# Patient Record
Sex: Male | Born: 1965 | Race: White | Hispanic: No | Marital: Single | Smoking: Current every day smoker
Health system: Southern US, Community
[De-identification: ages and names within clinical notes are randomized; demographics above are authoritative.]

## PROBLEM LIST (undated history)

## (undated) DIAGNOSIS — T300 Burn of unspecified body region, unspecified degree: Secondary | ICD-10-CM

## (undated) DIAGNOSIS — W3400XA Accidental discharge from unspecified firearms or gun, initial encounter: Secondary | ICD-10-CM

## (undated) DIAGNOSIS — I10 Essential (primary) hypertension: Secondary | ICD-10-CM

## (undated) DIAGNOSIS — R569 Unspecified convulsions: Secondary | ICD-10-CM

## (undated) DIAGNOSIS — F32A Depression, unspecified: Secondary | ICD-10-CM

## (undated) DIAGNOSIS — I219 Acute myocardial infarction, unspecified: Secondary | ICD-10-CM

## (undated) DIAGNOSIS — S21339A Puncture wound without foreign body of unspecified front wall of thorax with penetration into thoracic cavity, initial encounter: Secondary | ICD-10-CM

## (undated) DIAGNOSIS — I639 Cerebral infarction, unspecified: Secondary | ICD-10-CM

## (undated) DIAGNOSIS — I499 Cardiac arrhythmia, unspecified: Secondary | ICD-10-CM

## (undated) DIAGNOSIS — F1010 Alcohol abuse, uncomplicated: Secondary | ICD-10-CM

## (undated) DIAGNOSIS — F329 Major depressive disorder, single episode, unspecified: Secondary | ICD-10-CM

---

## 2006-02-05 ENCOUNTER — Emergency Department (HOSPITAL_COMMUNITY): Admission: EM | Admit: 2006-02-05 | Discharge: 2006-02-05 | Payer: Self-pay | Admitting: Emergency Medicine

## 2013-06-16 ENCOUNTER — Encounter (HOSPITAL_COMMUNITY): Payer: Self-pay | Admitting: Emergency Medicine

## 2013-06-16 ENCOUNTER — Emergency Department (HOSPITAL_COMMUNITY)
Admission: EM | Admit: 2013-06-16 | Discharge: 2013-06-17 | Disposition: A | Discharge status: Other Acute Inpatient Hospital | Payer: Self-pay | Attending: Emergency Medicine | Admitting: Emergency Medicine

## 2013-06-16 DIAGNOSIS — Z8673 Personal history of transient ischemic attack (TIA), and cerebral infarction without residual deficits: Secondary | ICD-10-CM | POA: Insufficient documentation

## 2013-06-16 DIAGNOSIS — G40909 Epilepsy, unspecified, not intractable, without status epilepticus: Secondary | ICD-10-CM | POA: Insufficient documentation

## 2013-06-16 DIAGNOSIS — Z87828 Personal history of other (healed) physical injury and trauma: Secondary | ICD-10-CM | POA: Insufficient documentation

## 2013-06-16 DIAGNOSIS — I252 Old myocardial infarction: Secondary | ICD-10-CM | POA: Insufficient documentation

## 2013-06-16 DIAGNOSIS — F1020 Alcohol dependence, uncomplicated: Secondary | ICD-10-CM | POA: Diagnosis present

## 2013-06-16 DIAGNOSIS — F17200 Nicotine dependence, unspecified, uncomplicated: Secondary | ICD-10-CM | POA: Insufficient documentation

## 2013-06-16 DIAGNOSIS — F32A Depression, unspecified: Secondary | ICD-10-CM | POA: Diagnosis present

## 2013-06-16 DIAGNOSIS — R21 Rash and other nonspecific skin eruption: Secondary | ICD-10-CM | POA: Insufficient documentation

## 2013-06-16 DIAGNOSIS — F10239 Alcohol dependence with withdrawal, unspecified: Secondary | ICD-10-CM

## 2013-06-16 DIAGNOSIS — Z9289 Personal history of other medical treatment: Secondary | ICD-10-CM

## 2013-06-16 DIAGNOSIS — F10939 Alcohol use, unspecified with withdrawal, unspecified: Secondary | ICD-10-CM

## 2013-06-16 DIAGNOSIS — F329 Major depressive disorder, single episode, unspecified: Secondary | ICD-10-CM | POA: Diagnosis present

## 2013-06-16 DIAGNOSIS — D696 Thrombocytopenia, unspecified: Secondary | ICD-10-CM | POA: Insufficient documentation

## 2013-06-16 DIAGNOSIS — Z09 Encounter for follow-up examination after completed treatment for conditions other than malignant neoplasm: Secondary | ICD-10-CM

## 2013-06-16 DIAGNOSIS — F10929 Alcohol use, unspecified with intoxication, unspecified: Secondary | ICD-10-CM

## 2013-06-16 DIAGNOSIS — I10 Essential (primary) hypertension: Secondary | ICD-10-CM | POA: Insufficient documentation

## 2013-06-16 DIAGNOSIS — R7989 Other specified abnormal findings of blood chemistry: Secondary | ICD-10-CM | POA: Insufficient documentation

## 2013-06-16 DIAGNOSIS — Z79899 Other long term (current) drug therapy: Secondary | ICD-10-CM | POA: Insufficient documentation

## 2013-06-16 DIAGNOSIS — F10229 Alcohol dependence with intoxication, unspecified: Secondary | ICD-10-CM | POA: Insufficient documentation

## 2013-06-16 DIAGNOSIS — F1010 Alcohol abuse, uncomplicated: Secondary | ICD-10-CM

## 2013-06-16 HISTORY — DX: Burn of unspecified body region, unspecified degree: T30.0

## 2013-06-16 HISTORY — DX: Major depressive disorder, single episode, unspecified: F32.9

## 2013-06-16 HISTORY — DX: Accidental discharge from unspecified firearms or gun, initial encounter: W34.00XA

## 2013-06-16 HISTORY — DX: Alcohol abuse, uncomplicated: F10.10

## 2013-06-16 HISTORY — DX: Essential (primary) hypertension: I10

## 2013-06-16 HISTORY — DX: Cerebral infarction, unspecified: I63.9

## 2013-06-16 HISTORY — DX: Puncture wound without foreign body of unspecified front wall of thorax with penetration into thoracic cavity, initial encounter: S21.339A

## 2013-06-16 HISTORY — DX: Cardiac arrhythmia, unspecified: I49.9

## 2013-06-16 HISTORY — DX: Acute myocardial infarction, unspecified: I21.9

## 2013-06-16 HISTORY — DX: Depression, unspecified: F32.A

## 2013-06-16 HISTORY — DX: Unspecified convulsions: R56.9

## 2013-06-16 LAB — URINALYSIS, ROUTINE W REFLEX MICROSCOPIC
Bilirubin Urine: NEGATIVE
Glucose, UA: NEGATIVE mg/dL
Hgb urine dipstick: NEGATIVE
Ketones, ur: NEGATIVE mg/dL
Leukocytes, UA: NEGATIVE
Nitrite: NEGATIVE
Protein, ur: NEGATIVE mg/dL
Specific Gravity, Urine: 1.004 — ABNORMAL LOW (ref 1.005–1.030)
Urobilinogen, UA: 1 mg/dL (ref 0.0–1.0)
pH: 6 (ref 5.0–8.0)

## 2013-06-16 LAB — CBC WITH DIFFERENTIAL/PLATELET
Basophils Absolute: 0.1 10*3/uL (ref 0.0–0.1)
Basophils Relative: 3 % — ABNORMAL HIGH (ref 0–1)
Eosinophils Absolute: 0.1 10*3/uL (ref 0.0–0.7)
Eosinophils Relative: 3 % (ref 0–5)
HCT: 41.2 % (ref 39.0–52.0)
Hemoglobin: 14.2 g/dL (ref 13.0–17.0)
Lymphocytes Relative: 38 % (ref 12–46)
Lymphs Abs: 1.2 10*3/uL (ref 0.7–4.0)
MCH: 35.1 pg — ABNORMAL HIGH (ref 26.0–34.0)
MCHC: 34.5 g/dL (ref 30.0–36.0)
MCV: 101.7 fL — ABNORMAL HIGH (ref 78.0–100.0)
Monocytes Absolute: 0.4 10*3/uL (ref 0.1–1.0)
Monocytes Relative: 13 % — ABNORMAL HIGH (ref 3–12)
Neutro Abs: 1.3 10*3/uL — ABNORMAL LOW (ref 1.7–7.7)
Neutrophils Relative %: 43 % (ref 43–77)
Platelets: 47 10*3/uL — ABNORMAL LOW (ref 150–400)
RBC: 4.05 MIL/uL — ABNORMAL LOW (ref 4.22–5.81)
RDW: 15.1 % (ref 11.5–15.5)
WBC: 3.1 10*3/uL — ABNORMAL LOW (ref 4.0–10.5)

## 2013-06-16 LAB — COMPREHENSIVE METABOLIC PANEL
ALT: 115 U/L — ABNORMAL HIGH (ref 0–53)
AST: 244 U/L — ABNORMAL HIGH (ref 0–37)
Albumin: 3.8 g/dL (ref 3.5–5.2)
Alkaline Phosphatase: 104 U/L (ref 39–117)
BUN: 7 mg/dL (ref 6–23)
CO2: 22 mEq/L (ref 19–32)
Calcium: 9 mg/dL (ref 8.4–10.5)
Chloride: 104 mEq/L (ref 96–112)
Creatinine, Ser: 0.47 mg/dL — ABNORMAL LOW (ref 0.50–1.35)
GFR calc Af Amer: >90 mL/min (ref >90)
GFR calc non Af Amer: >90 mL/min (ref >90)
Glucose, Bld: 82 mg/dL (ref 70–99)
Potassium: 4.5 mEq/L (ref 3.7–5.3)
Sodium: 141 mEq/L (ref 137–147)
Total Bilirubin: 2 mg/dL — ABNORMAL HIGH (ref 0.3–1.2)
Total Protein: 9 g/dL — ABNORMAL HIGH (ref 6.0–8.3)

## 2013-06-16 LAB — ETHANOL: Alcohol, Ethyl (B): 325 mg/dL — ABNORMAL HIGH (ref 0–11)

## 2013-06-16 LAB — RAPID URINE DRUG SCREEN, HOSP PERFORMED
Amphetamines: NOT DETECTED
Barbiturates: NOT DETECTED
Benzodiazepines: NOT DETECTED
Cocaine: NOT DETECTED
Opiates: NOT DETECTED
Tetrahydrocannabinol: NOT DETECTED

## 2013-06-16 MED ORDER — TERBINAFINE HCL 1 % EX CREA
TOPICAL_CREAM | Freq: Two times a day (BID) | CUTANEOUS | Status: DC
  Administered 2013-06-16 – 2013-06-17 (×2): via TOPICAL
  Filled 2013-06-16: qty 12

## 2013-06-16 MED ORDER — SERTRALINE HCL 50 MG PO TABS
50.0000 mg | ORAL_TABLET | Freq: Every day | ORAL | Status: DC
  Administered 2013-06-17: 50 mg via ORAL
  Filled 2013-06-16: qty 1

## 2013-06-16 MED ORDER — LORAZEPAM 1 MG PO TABS
0.0000 mg | ORAL_TABLET | Freq: Four times a day (QID) | ORAL | Status: DC
  Administered 2013-06-16: 2 mg via ORAL
  Administered 2013-06-17: 1 mg via ORAL
  Filled 2013-06-16: qty 1
  Filled 2013-06-16: qty 2

## 2013-06-16 MED ORDER — GABAPENTIN 300 MG PO CAPS
600.0000 mg | ORAL_CAPSULE | Freq: Every day | ORAL | Status: DC
  Administered 2013-06-16: 600 mg via ORAL
  Filled 2013-06-16 (×2): qty 2

## 2013-06-16 MED ORDER — VITAMIN B-1 100 MG PO TABS
100.0000 mg | ORAL_TABLET | Freq: Every day | ORAL | Status: DC
  Administered 2013-06-16 – 2013-06-17 (×2): 100 mg via ORAL
  Filled 2013-06-16 (×3): qty 1

## 2013-06-16 MED ORDER — LORAZEPAM 1 MG PO TABS: 0.0000 mg | ORAL_TABLET | Freq: Two times a day (BID) | ORAL | Status: DC

## 2013-06-16 MED ORDER — GABAPENTIN 300 MG PO CAPS
300.0000 mg | ORAL_CAPSULE | Freq: Two times a day (BID) | ORAL | Status: DC
  Administered 2013-06-17: 300 mg via ORAL
  Filled 2013-06-16 (×2): qty 1

## 2013-06-16 MED ORDER — GABAPENTIN 300 MG PO CAPS: 300.0000 mg | ORAL_CAPSULE | Freq: Three times a day (TID) | ORAL | Status: DC

## 2013-06-16 MED ORDER — THIAMINE HCL 100 MG/ML IJ SOLN: 100.0000 mg | Freq: Every day | INTRAMUSCULAR | Status: DC

## 2013-06-16 MED ORDER — LORAZEPAM 1 MG PO TABS
1.0000 mg | ORAL_TABLET | Freq: Once | ORAL | Status: AC
  Administered 2013-06-16: 1 mg via ORAL
  Filled 2013-06-16: qty 1

## 2013-06-16 NOTE — ED Notes (Signed)
Family at bedside. Case management speaking with pt and family

## 2013-06-16 NOTE — ED Notes (Signed)
AWARE ZOLOFT HAD NO BEEN GIVEN

## 2013-06-16 NOTE — Progress Notes (Signed)
[facility] CL provided pt with a [employer] Orange Card application, highlighting [facility] and a list of primary care resources.
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
## 2013-06-16 NOTE — ED Notes (Addendum)
MD at bedside. EDP Kohut present speaking with family and pt

## 2013-06-16 NOTE — ED Notes (Signed)
Patient denies SI, HI, AVH. C/o detox symptoms. States that he has a history of depression, OCD, PTSD, and paranoia. States all that he has been doing lately is drinking. States he is ready to get help. Encouragement offered. Ativan, B1, Gatorade given. Patient safety maintained, Q 15 checks in place.

## 2013-06-16 NOTE — ED Notes (Signed)
Pt states that he is here for detox from ETOH. Pt states that his last drink was 5330mins-1hour before he got here.  Pt states that he has been drinking since he was [age]years old and drinks 10-40oz beers a day. Pt states that he has seizures that he doesn't take meds for them, he, his wife, or his daughter Talk them away".

## 2013-06-16 NOTE — Progress Notes (Signed)
   CARE MANAGEMENT ED NOTE 06/16/2013  Patient:  Ponciano OrtBURRELL,Markie   Account Number:  [id number]  Date Initiated:  06/16/2013  Documentation initiated by:  Radford PaxFERRERO,Tannen Vandezande  Subjective/Objective Assessment:   patient presents to Ed with wanting detox from alcohol.     Subjective/Objective Assessment Detail:     Action/Plan:   Action/Plan Detail:   Anticipated DC Date:       Status Recommendation to Physician:   Result of Recommendation:    Other ED Services  Consult Working Plan    DC Planning Services  Other  PCP issues    Choice offered to / List presented to:            Status of service:  Completed, signed off  ED Comments:   ED Comments Detail:  06/16/2013 A. Airelle Everding RNCM 2043pm. EDCM spoke to patient and his ex wife at bedside.  Patient reports his ex wife is his legal guardian.  Patient confirms he does not have a pcp or insurance.  EDCM provided patient's ex wife with information regarding Medicaid and Affordable Care Act for insurance, discounted pharmacies and needymeds.org for medication assistance, financial assistance in the community such as local churches and salvation [employer], urban ministries, a list of pcps whoa accept self pay patients, information regarding [facility] and dental assistance for uninsured patients.  Patient and his ex wife thankful for resources. No further EDCM needs at this time.          Cristy FriedlanderStacy Wright  Signed  Progress Notes Service date: 06/16/2013 4:19 PM [facility] provided pt with a [employer] Orange Card application, highlighting [facility] and a list of primary care resources.

## 2013-06-16 NOTE — ED Notes (Signed)
Talking with TTS at present. 

## 2013-06-17 ENCOUNTER — Inpatient Hospital Stay (HOSPITAL_COMMUNITY)
Admission: AD | Admit: 2013-06-17 | Discharge: 2013-06-21 | DRG: 896 | Disposition: A | Discharge status: Other Acute Inpatient Hospital | Payer: No Typology Code available for payment source | Source: Intra-hospital | Attending: Internal Medicine | Admitting: Internal Medicine

## 2013-06-17 ENCOUNTER — Encounter (HOSPITAL_COMMUNITY): Payer: Self-pay | Admitting: *Deleted

## 2013-06-17 ENCOUNTER — Encounter (HOSPITAL_COMMUNITY): Payer: Self-pay | Admitting: Registered Nurse

## 2013-06-17 DIAGNOSIS — R7401 Elevation of levels of liver transaminase levels: Secondary | ICD-10-CM | POA: Diagnosis present

## 2013-06-17 DIAGNOSIS — F411 Generalized anxiety disorder: Secondary | ICD-10-CM | POA: Diagnosis present

## 2013-06-17 DIAGNOSIS — F10231 Alcohol dependence with withdrawal delirium: Secondary | ICD-10-CM

## 2013-06-17 DIAGNOSIS — R5383 Other fatigue: Secondary | ICD-10-CM

## 2013-06-17 DIAGNOSIS — F10931 Alcohol use, unspecified with withdrawal delirium: Principal | ICD-10-CM | POA: Diagnosis present

## 2013-06-17 DIAGNOSIS — I252 Old myocardial infarction: Secondary | ICD-10-CM

## 2013-06-17 DIAGNOSIS — F10239 Alcohol dependence with withdrawal, unspecified: Secondary | ICD-10-CM

## 2013-06-17 DIAGNOSIS — F32A Depression, unspecified: Secondary | ICD-10-CM

## 2013-06-17 DIAGNOSIS — G9340 Encephalopathy, unspecified: Secondary | ICD-10-CM | POA: Diagnosis present

## 2013-06-17 DIAGNOSIS — F17200 Nicotine dependence, unspecified, uncomplicated: Secondary | ICD-10-CM | POA: Diagnosis present

## 2013-06-17 DIAGNOSIS — R7402 Elevation of levels of lactic acid dehydrogenase (LDH): Secondary | ICD-10-CM | POA: Diagnosis present

## 2013-06-17 DIAGNOSIS — Z09 Encounter for follow-up examination after completed treatment for conditions other than malignant neoplasm: Secondary | ICD-10-CM

## 2013-06-17 DIAGNOSIS — Z79899 Other long term (current) drug therapy: Secondary | ICD-10-CM

## 2013-06-17 DIAGNOSIS — Z9289 Personal history of other medical treatment: Secondary | ICD-10-CM

## 2013-06-17 DIAGNOSIS — R5381 Other malaise: Secondary | ICD-10-CM | POA: Diagnosis present

## 2013-06-17 DIAGNOSIS — F10939 Alcohol use, unspecified with withdrawal, unspecified: Secondary | ICD-10-CM

## 2013-06-17 DIAGNOSIS — F329 Major depressive disorder, single episode, unspecified: Secondary | ICD-10-CM | POA: Diagnosis present

## 2013-06-17 DIAGNOSIS — I10 Essential (primary) hypertension: Secondary | ICD-10-CM | POA: Diagnosis present

## 2013-06-17 DIAGNOSIS — R112 Nausea with vomiting, unspecified: Secondary | ICD-10-CM | POA: Diagnosis present

## 2013-06-17 DIAGNOSIS — R740 Nonspecific elevation of levels of transaminase and lactic acid dehydrogenase [LDH]: Secondary | ICD-10-CM

## 2013-06-17 DIAGNOSIS — E871 Hypo-osmolality and hyponatremia: Secondary | ICD-10-CM | POA: Diagnosis present

## 2013-06-17 DIAGNOSIS — F1910 Other psychoactive substance abuse, uncomplicated: Secondary | ICD-10-CM | POA: Diagnosis present

## 2013-06-17 DIAGNOSIS — G4700 Insomnia, unspecified: Secondary | ICD-10-CM | POA: Diagnosis present

## 2013-06-17 DIAGNOSIS — R61 Generalized hyperhidrosis: Secondary | ICD-10-CM | POA: Diagnosis present

## 2013-06-17 DIAGNOSIS — F1020 Alcohol dependence, uncomplicated: Secondary | ICD-10-CM | POA: Diagnosis present

## 2013-06-17 DIAGNOSIS — F10229 Alcohol dependence with intoxication, unspecified: Secondary | ICD-10-CM | POA: Diagnosis present

## 2013-06-17 DIAGNOSIS — J189 Pneumonia, unspecified organism: Secondary | ICD-10-CM | POA: Diagnosis present

## 2013-06-17 LAB — ETHANOL: Alcohol, Ethyl (B): 140 mg/dL — ABNORMAL HIGH (ref 0–11)

## 2013-06-17 MED ORDER — CHLORDIAZEPOXIDE HCL 25 MG PO CAPS
50.0000 mg | ORAL_CAPSULE | Freq: Once | ORAL | Status: AC
  Administered 2013-06-17: 50 mg via ORAL
  Filled 2013-06-17: qty 2

## 2013-06-17 MED ORDER — CHLORDIAZEPOXIDE HCL 25 MG PO CAPS
25.0000 mg | ORAL_CAPSULE | Freq: Four times a day (QID) | ORAL | Status: DC | PRN
  Administered 2013-06-18 – 2013-06-19 (×2): 25 mg via ORAL
  Filled 2013-06-17 (×2): qty 1

## 2013-06-17 MED ORDER — GABAPENTIN 300 MG PO CAPS
300.0000 mg | ORAL_CAPSULE | Freq: Two times a day (BID) | ORAL | Status: DC
  Administered 2013-06-17 – 2013-06-21 (×9): 300 mg via ORAL
  Filled 2013-06-17 (×12): qty 1

## 2013-06-17 MED ORDER — CHLORDIAZEPOXIDE HCL 25 MG PO CAPS: 25.0000 mg | ORAL_CAPSULE | ORAL | Status: DC

## 2013-06-17 MED ORDER — LORAZEPAM 1 MG PO TABS
0.0000 mg | ORAL_TABLET | Freq: Four times a day (QID) | ORAL | Status: DC
  Administered 2013-06-17: 2 mg via ORAL
  Filled 2013-06-17: qty 2

## 2013-06-17 MED ORDER — SERTRALINE HCL 50 MG PO TABS
50.0000 mg | ORAL_TABLET | Freq: Every day | ORAL | Status: DC
  Administered 2013-06-17 – 2013-06-21 (×5): 50 mg via ORAL
  Filled 2013-06-17 (×7): qty 1

## 2013-06-17 MED ORDER — THIAMINE HCL 100 MG/ML IJ SOLN
100.0000 mg | Freq: Once | INTRAMUSCULAR | Status: AC
  Administered 2013-06-17: 100 mg via INTRAMUSCULAR

## 2013-06-17 MED ORDER — VITAMIN B-1 100 MG PO TABS
100.0000 mg | ORAL_TABLET | Freq: Every day | ORAL | Status: DC
  Administered 2013-06-18 – 2013-06-21 (×4): 100 mg via ORAL
  Filled 2013-06-17 (×6): qty 1

## 2013-06-17 MED ORDER — HYDROXYZINE HCL 25 MG PO TABS: 25.0000 mg | ORAL_TABLET | Freq: Four times a day (QID) | ORAL | Status: DC | PRN

## 2013-06-17 MED ORDER — MAGNESIUM HYDROXIDE 400 MG/5ML PO SUSP: 30.0000 mL | Freq: Every day | ORAL | Status: DC | PRN

## 2013-06-17 MED ORDER — LORATADINE 10 MG PO TABS
10.0000 mg | ORAL_TABLET | Freq: Every day | ORAL | Status: DC
  Administered 2013-06-17 – 2013-06-21 (×5): 10 mg via ORAL
  Filled 2013-06-17 (×7): qty 1

## 2013-06-17 MED ORDER — ALUM & MAG HYDROXIDE-SIMETH 200-200-20 MG/5ML PO SUSP: 30.0000 mL | ORAL | Status: DC | PRN

## 2013-06-17 MED ORDER — CHLORDIAZEPOXIDE HCL 25 MG PO CAPS
25.0000 mg | ORAL_CAPSULE | Freq: Three times a day (TID) | ORAL | Status: DC
Start: 2013-06-19 — End: 2013-06-19
  Administered 2013-06-19 (×2): 25 mg via ORAL
  Filled 2013-06-17 (×2): qty 1

## 2013-06-17 MED ORDER — GABAPENTIN 300 MG PO CAPS
600.0000 mg | ORAL_CAPSULE | Freq: Every day | ORAL | Status: DC
  Administered 2013-06-17 – 2013-06-20 (×4): 600 mg via ORAL
  Filled 2013-06-17 (×6): qty 2

## 2013-06-17 MED ORDER — CHLORDIAZEPOXIDE HCL 25 MG PO CAPS: 25.0000 mg | ORAL_CAPSULE | Freq: Three times a day (TID) | ORAL | Status: DC

## 2013-06-17 MED ORDER — ADULT MULTIVITAMIN W/MINERALS CH
1.0000 | ORAL_TABLET | Freq: Every day | ORAL | Status: DC
  Administered 2013-06-17: 1 via ORAL
  Filled 2013-06-17: qty 1

## 2013-06-17 MED ORDER — CHLORDIAZEPOXIDE HCL 25 MG PO CAPS
25.0000 mg | ORAL_CAPSULE | Freq: Four times a day (QID) | ORAL | Status: AC
  Administered 2013-06-17 – 2013-06-18 (×5): 25 mg via ORAL
  Filled 2013-06-17 (×5): qty 1

## 2013-06-17 MED ORDER — ONDANSETRON 4 MG PO TBDP
4.0000 mg | ORAL_TABLET | Freq: Four times a day (QID) | ORAL | Status: AC | PRN
  Administered 2013-06-20: 4 mg via ORAL
  Filled 2013-06-17: qty 1

## 2013-06-17 MED ORDER — ADULT MULTIVITAMIN W/MINERALS CH
1.0000 | ORAL_TABLET | Freq: Every day | ORAL | Status: DC
  Administered 2013-06-18 – 2013-06-21 (×4): 1 via ORAL
  Filled 2013-06-17 (×7): qty 1

## 2013-06-17 MED ORDER — ACETAMINOPHEN 325 MG PO TABS
650.0000 mg | ORAL_TABLET | Freq: Four times a day (QID) | ORAL | Status: DC | PRN
Start: 2013-06-17 — End: 2013-06-22
  Administered 2013-06-20: 650 mg via ORAL
  Filled 2013-06-17 (×2): qty 2

## 2013-06-17 MED ORDER — CHLORDIAZEPOXIDE HCL 25 MG PO CAPS: 25.0000 mg | ORAL_CAPSULE | Freq: Four times a day (QID) | ORAL | Status: DC | PRN

## 2013-06-17 MED ORDER — CHLORDIAZEPOXIDE HCL 25 MG PO CAPS
25.0000 mg | ORAL_CAPSULE | Freq: Four times a day (QID) | ORAL | Status: DC
  Administered 2013-06-17: 25 mg via ORAL
  Filled 2013-06-17: qty 1

## 2013-06-17 MED ORDER — LORAZEPAM 1 MG PO TABS: 0.0000 mg | ORAL_TABLET | Freq: Two times a day (BID) | ORAL | Status: DC

## 2013-06-17 MED ORDER — LOPERAMIDE HCL 2 MG PO CAPS: 2.0000 mg | ORAL_CAPSULE | ORAL | Status: DC | PRN

## 2013-06-17 MED ORDER — CHLORDIAZEPOXIDE HCL 25 MG PO CAPS: 25.0000 mg | ORAL_CAPSULE | Freq: Every day | ORAL | Status: DC

## 2013-06-17 MED ORDER — LORAZEPAM 2 MG/ML IJ SOLN: 1.0000 mg | Freq: Four times a day (QID) | INTRAMUSCULAR | Status: DC | PRN

## 2013-06-17 MED ORDER — HYDROCORTISONE 1 % EX CREA
TOPICAL_CREAM | Freq: Three times a day (TID) | CUTANEOUS | Status: DC
  Administered 2013-06-17 – 2013-06-18 (×2): via TOPICAL
  Administered 2013-06-18: 1 via TOPICAL
  Administered 2013-06-18 – 2013-06-21 (×5): via TOPICAL
  Filled 2013-06-17: qty 28

## 2013-06-17 MED ORDER — CHLORDIAZEPOXIDE HCL 25 MG PO CAPS
25.0000 mg | ORAL_CAPSULE | Freq: Once | ORAL | Status: AC
  Administered 2013-06-17: 25 mg via ORAL
  Filled 2013-06-17: qty 1

## 2013-06-17 MED ORDER — LORAZEPAM 1 MG PO TABS: 1.0000 mg | ORAL_TABLET | Freq: Four times a day (QID) | ORAL | Status: DC | PRN

## 2013-06-17 MED ORDER — HYDROXYZINE HCL 25 MG PO TABS
25.0000 mg | ORAL_TABLET | Freq: Four times a day (QID) | ORAL | Status: AC | PRN
  Administered 2013-06-17 – 2013-06-20 (×3): 25 mg via ORAL
  Filled 2013-06-17 (×3): qty 1

## 2013-06-17 MED ORDER — LOPERAMIDE HCL 2 MG PO CAPS: 2.0000 mg | ORAL_CAPSULE | ORAL | Status: AC | PRN

## 2013-06-17 MED ORDER — ONDANSETRON 4 MG PO TBDP: 4.0000 mg | ORAL_TABLET | Freq: Four times a day (QID) | ORAL | Status: DC | PRN
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
## 2013-06-17 NOTE — ED Notes (Signed)
[facility] requested.

## 2013-06-17 NOTE — Consult Note (Signed)
  Ronnie Mays is a 48 y.o. male who [facility]. Pt denies SI/HI/AVH. Pt reports the following: Pt has been drinking for approx 30 yrs and has previous detox hx, most recently in 2014. Pt drinks 10-40's, daily, last drink was 1 hr prior to checking in to the emerg dept. Pt stated--"I finished the last 2 cups of the 40 I had". Pt states he has seizures and takes no medications and feels they are attributed to his alcoholism   Patient states that he drinks daily. Patient states that he is not feeling well at this time (shaking all over). [facility] Manus RuddVenedis.  Patient wants detox.  Review of Systems  Constitutional: Positive for chills and diaphoresis.  Eyes: Negative.   Respiratory: Negative for cough.   Cardiovascular: Negative for palpitations.  Gastrointestinal: Positive for nausea and vomiting. Negative for diarrhea and constipation.  Musculoskeletal: Positive for back pain and neck pain. Negative for falls.  Neurological: Positive for tremors, seizures and weakness. Negative for loss of consciousness and headaches.  Psychiatric/Behavioral: Positive for depression and substance abuse. Negative for suicidal ideas and hallucinations. The patient is not nervous/anxious and does not have insomnia.      Disposition:  Inpatient detox treatment recommended.  [facility] Ronnie Vora B. Stanton Kissoon FNP-BC

## 2013-06-17 NOTE — BH Assessment (Signed)
[facility]. [facility]. Originals placed in pt's chart.  Evette Cristalaroline Paige Kenedie Dirocco, [place] Assessment Counselor

## 2013-06-17 NOTE — Progress Notes (Signed)
The following facilities have been contacted regarding bed availability for detox:  [facility]- per Dennie BiblePat at capacity and not accepting referrals [facility]- per Eileen StanfordJenna they do have beds available but do not accept pt's with a hx of seizures [facility]- per Ortencia KickByron have availability, referral faxed   Tomi BambergerMariya Maely Clements Disposition [facility]

## 2013-06-17 NOTE — Progress Notes (Signed)
Patient ID: Ronnie Mays, male   DOB: 1966-02-22, 48 y.o.   MRN: [id number] Nursing admission note: [facility].  Patient has a CIWA of 15; he is unsteady on his feet; needs assistance with walking.  He is a high fall risk at this time.  Patient states he drinks 10 40-oz beers per day.  He states, "sometimes I will drink some liquor."  Patient states he has been in rehab twice; [place] [facility].  He states that he has been drinking for 30 years.  Patient reports nausea, headache and general aches.  Patient has significant tremors.  He has sad affect; depressed mood.  Patient denies any SI/HI/AVH.  Patient is homeless stating, "sometimes I stay with my ex and she will give me some spending money."  Patient was placed on the librium protocol.  He was oriented to room and unit.

## 2013-06-17 NOTE — Progress Notes (Signed)
Patient ID: [name], male   DOB: 22-Jan-1966, 48 y.o.   MRN: [id number] D: Pt. In bed reports tremors and sweating. A: Writer provides emotional support and reviewed medication regime. Staff will monitor q8115min for safety. R: Pt. Is safe on the unit, he did not attend group.

## 2013-06-18 ENCOUNTER — Other Ambulatory Visit: Payer: Self-pay | Admitting: Student

## 2013-06-18 ENCOUNTER — Encounter (HOSPITAL_COMMUNITY): Payer: Self-pay | Admitting: Psychiatry

## 2013-06-18 DIAGNOSIS — F1020 Alcohol dependence, uncomplicated: Secondary | ICD-10-CM | POA: Diagnosis present

## 2013-06-18 MED ORDER — LORAZEPAM 1 MG PO TABS
ORAL_TABLET | ORAL | Status: AC
  Filled 2013-06-18: qty 2

## 2013-06-18 MED ORDER — BENAZEPRIL HCL 20 MG PO TABS
20.0000 mg | ORAL_TABLET | Freq: Once | ORAL | Status: AC
  Administered 2013-06-18: 20 mg via ORAL
  Filled 2013-06-18: qty 2
  Filled 2013-06-18: qty 1

## 2013-06-18 MED ORDER — LORAZEPAM 1 MG PO TABS
2.0000 mg | ORAL_TABLET | Freq: Once | ORAL | Status: AC
  Administered 2013-06-18: 2 mg via ORAL

## 2013-06-18 MED ORDER — DIPHENHYDRAMINE HCL 25 MG PO CAPS
50.0000 mg | ORAL_CAPSULE | Freq: Every evening | ORAL | Status: DC | PRN
  Administered 2013-06-18 – 2013-06-20 (×3): 50 mg via ORAL
  Filled 2013-06-18 (×3): qty 2

## 2013-06-18 MED ORDER — BENAZEPRIL HCL 10 MG PO TABS
10.0000 mg | ORAL_TABLET | Freq: Every day | ORAL | Status: DC
  Administered 2013-06-19 – 2013-06-21 (×3): 10 mg via ORAL
  Filled 2013-06-18 (×4): qty 1

## 2013-06-18 NOTE — Progress Notes (Signed)
The focus of this group is to educate the patient on the purpose and policies of crisis stabilization and provide a format to answer questions about their admission.  The group details unit policies and expectations of patients while admitted. ° °Pt did not attend. °

## 2013-06-18 NOTE — [facility] Suicide Risk Assessment (Signed)
Suicide Risk Assessment  Admission Assessment     Nursing information obtained from:    Demographic factors:    Current Mental Status:    Loss Factors:    Historical Factors:    Risk Reduction Factors:    Total Time spent with patient: 45 minutes  CLINICAL FACTORS:   Depression:   Comorbid alcohol abuse/dependence Alcohol/Substance Abuse/Dependencies  Psychiatric Specialty Exam:     Blood pressure 163/75, pulse 122, temperature 97.6 F (36.4 C), temperature source Oral, resp. rate 16, height 5\' 10"  (1.778 m), weight 74.844 kg (165 lb).Body mass index is 23.68 kg/(m^2).  General Appearance: Disheveled  Eye Contact::  Minimal  Speech:  Clear and Coherent and not spontaneous  Volume:  Decreased  Mood:  Anxious, Depressed and worried, "sick"  Affect:  anxious, sad, worried  Thought Process:  Coherent and Goal Directed  Orientation:  Other:  person place  Thought Content:  symptoms, worries, concerns  Suicidal Thoughts:  No  Homicidal Thoughts:  No  Memory:  Immediate;   Fair Recent;   Fair Remote;   Fair  Judgement:  Fair  Insight:  Shallow  Psychomotor Activity:  Restlessness and Tremor  Concentration:  Poor  Recall:  [employer]Fair  Fund of Knowledge:Fair  Language: Fair  Akathisia:  No  Handed:    AIMS (if indicated):     Assets:  Desire for Improvement  Sleep:  Number of Hours: 6.75   Musculoskeletal: Strength & Muscle Tone: within normal limits Gait & Station: unsteady Patient leans: Front  COGNITIVE FEATURES THAT CONTRIBUTE TO RISK:  Closed-mindedness Polarized thinking Thought constriction (tunnel vision)    SUICIDE RISK:   Minimal: No identifiable suicidal ideation.  Patients presenting with no risk factors but with morbid ruminations; may be classified as minimal risk based on the severity of the depressive symptoms  PLAN OF CARE: Supportive approach/coping skills/relapse prevention                              Librium or Ativan Detox Protocol               Reassess and address the co morbidities  I certify that inpatient services furnished can reasonably be expected to improve the patient's condition.  Gwendloyn Forsee A 06/18/2013, 4:29 PM

## 2013-06-18 NOTE — Consult Note (Signed)
Face to face interview, note reviewed and agreed with 
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
## 2013-06-18 NOTE — Progress Notes (Signed)
Patient ID: Ronnie Mays, male   DOB: April 19, 1966, 48 y.o.   MRN: [id number] D: pt. Lying in bed, reports "feel better than I did earlier, just need something to help me sleep." Pt.reports depression "7" of 10, tremors noted(see CIWA).  A: Writer provided emotional support encouraged client to drink plenty fluids. Writer consulted with Kirby CriglerFran H. NP requesting sleep aide. Staff will monitor q115min for safety. R: Pt. Is safe on the unit, he did not attend group, received order for Diphenhydramine 50 mg for sleep.

## 2013-06-18 NOTE — Progress Notes (Signed)
Recreation Therapy Notes  Date: 01.29.2015 Time: 2:45pm Location: [address] Dayroom   Group Topic: Leisure Education  Goal Area(s) Addresses:  Patient will identify positive leisure activities.  Patient will identify one positive benefit of participation in leisure activities.   Behavioral Response: Did not attend.    Kendal Raffo L Hildegarde Dunaway, LRT/CTRS  Elysse Polidore L 06/18/2013 5:04 PM 

## 2013-06-18 NOTE — Progress Notes (Signed)
D:  Patient up and visible in the milieu, but admits to really not feeling well.  Requested to have a dinner tray brought back as he did not feel he would be able to make it down there and back.  He denies any depression or hopelessness today, but rates anxiety at 5.  He denies thoughts of suicide or self harm.  His CIWA score was 10.   A:  Medications given as prescribed.  Encouraged patient to drink plenty of fluids.  Allowed him to stay back from dinner and a tray was brought back for him.   R:  Cooperative with staff.  Interacting with peers earlier in the shift, but not feeling well later and opted to lie down for a while.  Tolerating detox thus far.  Safety is maintained on the unit.
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
## 2013-06-18 NOTE — [facility] Group Notes (Signed)
[facility] LCSW Group Therapy  06/18/2013 3:22 PM  Type of Therapy:  Group Therapy  Participation Level:  Minimal  Participation Quality:  Attentive  Affect:  Depressed and Flat  Cognitive:  Alert and Oriented  Insight:  Improving  Engagement in Therapy:  Improving  Modes of Intervention:  Confrontation, Discussion, Education, Exploration, Problem-solving, Rapport Building, Socialization and Support  Summary of Progress/Problems:  Finding Balance in Life. Today's group focused on defining balance in one's own words, identifying things that can knock one off balance, and exploring healthy ways to maintain balance in life. Group members were asked to provide an example of a time when they felt off balance, describe how they handled that situation,and process healthier ways to regain balance in the future. Group members were asked to share the most important tool for maintaining balance that they learned [facility] at [facility] and how they plan to apply this method after discharge. Ronnie Mays was attentive throughout today's group but is struggling with intense withdrawal symptoms that make it difficult for him to actively participate in group discussion. He stated that he plans to start eating three meals a day in order to get himself stronger. Ronnie Mays shows improving insight AEB his ability to process how being a heavy drinker since age 48 has affected his relationships, physical and mental health, and social relationships. Ronnie Mays identified ways to reestablish a sense of balance in his life by attending groups and going to [facility] after detox to learn coping skills and be in a supportive environment.    Smart, Brycelynn Stampley LCSWA  06/18/2013, 3:22 PM

## 2013-06-18 NOTE — [facility] Suicide Risk Assessment (Signed)
[facility] INPATIENT:  Family/Significant Other Suicide Prevention Education  Suicide Prevention Education:  Education Completed; Ronnie Mays (pt's wife) [phone] has been identified by the patient as the family member/significant other with whom the patient will be residing, and identified as the person(s) who will aid the patient in the event of a mental health crisis (suicidal ideations/suicide attempt).  With written consent from the patient, the family member/significant other has been provided the following suicide prevention education, prior to the and/or following the discharge of the patient.  The suicide prevention education provided includes the following:  Suicide risk factors  Suicide prevention and interventions  National Suicide Hotline telephone number  [facility] assessment telephone number  [facility] Emergency Assistance 911  [facility] and/or Residential Mobile Crisis Unit telephone number  Request made of family/significant other to:  Remove weapons (e.g., guns, rifles, knives), all items previously/currently identified as safety concern.    Remove drugs/medications (over-the-counter, prescriptions, illicit drugs), all items previously/currently identified as a safety concern.  The family member/significant other verbalizes understanding of the suicide prevention education information provided.  The family member/significant other agrees to remove the items of safety concern listed above.  SPE not required for this pt, but pt requested CSW contact his wife to inform her of his admission and discuss aftercare/treatment. CSW also completed SPE. Pt's wife stated that he is able to return home at d/c if he has to wait for a bed to become available at a treatment facility.   Smart, Ronnie Mays LCSWA  06/18/2013, 10:50 AM

## 2013-06-18 NOTE — Progress Notes (Signed)
Patient ID: Ronnie Mays, male   DOB: 1965-11-21, 48 y.o.   MRN: [id number] D: Pt. Lying in bed reports, "still shaking, but better than yesterday" A: Writer provided emotional support and administered medications as scheduled. Staff will monitor q4815min for safety. R: Pt. Is safe on the unit. Pt. Did not attend group.

## 2013-06-19 MED ORDER — LORAZEPAM 1 MG PO TABS
1.0000 mg | ORAL_TABLET | Freq: Four times a day (QID) | ORAL | Status: DC | PRN
  Administered 2013-06-20 – 2013-06-21 (×4): 1 mg via ORAL
  Filled 2013-06-19 (×4): qty 1

## 2013-06-19 MED ORDER — LORAZEPAM 1 MG PO TABS: 1.0000 mg | ORAL_TABLET | Freq: Two times a day (BID) | ORAL | Status: DC

## 2013-06-19 MED ORDER — LORAZEPAM 1 MG PO TABS
1.0000 mg | ORAL_TABLET | Freq: Three times a day (TID) | ORAL | Status: DC
  Administered 2013-06-21: 1 mg via ORAL
  Filled 2013-06-19: qty 1

## 2013-06-19 MED ORDER — LORAZEPAM 1 MG PO TABS: 1.0000 mg | ORAL_TABLET | Freq: Every day | ORAL | Status: DC

## 2013-06-19 MED ORDER — LORAZEPAM 1 MG PO TABS
1.0000 mg | ORAL_TABLET | Freq: Four times a day (QID) | ORAL | Status: AC
  Administered 2013-06-19 – 2013-06-20 (×6): 1 mg via ORAL
  Filled 2013-06-19 (×6): qty 1

## 2013-06-19 MED ORDER — ENSURE COMPLETE PO LIQD
237.0000 mL | Freq: Three times a day (TID) | ORAL | Status: DC
  Administered 2013-06-19 – 2013-06-20 (×4): 237 mL via ORAL
  Filled 2013-06-19: qty 237

## 2013-06-19 NOTE — Progress Notes (Signed)
Adult Psychoeducational Group Note  Date:  06/19/2013 Time:  3:08 PM  Group Topic/Focus:  Early Warning Signs:   The focus of this group is to help patients identify signs or symptoms they exhibit before slipping into an unhealthy state or crisis.  Participation Level:  Did Not Attend  Additional Comments:  Pts discussed early warning signs for relapse based on behavior, attitude, thought, and feeling changes. After identifying these warning signs pts came up with one positive coping skill they could do when they see these unhealthy warnings. Pt was heavily detoxing and was in bed.  Caswell CorwinOwen, Corry Ihnen C 06/19/2013, 3:08 PM

## 2013-06-19 NOTE — Progress Notes (Signed)
[facility] MD Progress Note  06/19/2013 3:42 PM Ronnie Mays  MRN:  [id number] Subjective:  Ronnie Mays continues to have a hard time with his detox. He is having sweats, tremors, restlessness, insomnia. He did tolerate some bites of food. States he is having a hard time ambulating. States this is the worst withdrawal he has had. He is wanting to go to rehab as states he is not going to be able to make if he does not get more help Diagnosis:   DSM5: Schizophrenia Disorders:  none Obsessive-Compulsive Disorders:  none Trauma-Stressor Disorders:  none Substance/Addictive Disorders:  Alcohol Related Disorder - Severe (303.90) Depressive Disorders:  Major Depressive Disorder - Severe (296.23) Total Time spent with patient: 30 minutes  Axis I: Substance Induced Mood Disorder  ADL's:  Intact  Sleep: Poor  Appetite:  Poor  Suicidal Ideation:  Plan:  denies Intent:  denies Means:  denies Homicidal Ideation:  Plan:  denies Intent:  denies Means:  denies AEB (as evidenced by):  Psychiatric Specialty Exam: Physical Exam  Review of Systems  Constitutional: Positive for malaise/fatigue.  Eyes: Negative.   Respiratory: Negative.   Cardiovascular: Negative.   Gastrointestinal: Negative.   Genitourinary: Negative.   Musculoskeletal: Positive for myalgias.  Skin: Negative.   Neurological: Positive for dizziness, tremors, weakness and headaches.  Psychiatric/Behavioral: Positive for substance abuse. The patient is nervous/anxious and has insomnia.     Blood pressure 100/69, pulse 80, temperature 97.6 F (36.4 C), temperature source Oral, resp. rate 20, height 5\' 10"  (1.778 m), weight 74.844 kg (165 lb).Body mass index is 23.68 kg/(m^2).  General Appearance: Disheveled  Eye Contact::  Minimal  Speech:  Clear and Coherent, Slow and not spontaneous  Volume:  Decreased  Mood:  Anxious, Depressed and not feeling well  Affect:  anxious, "shaky" in pain  Thought Process:  Coherent and Goal Directed   Orientation:  Full (Time, Place, and Person)  Thought Content:  sympotms, worries, concerns  Suicidal Thoughts:  No  Homicidal Thoughts:  No  Memory:  Immediate;   Fair Recent;   Fair Remote;   Fair  Judgement:  Fair  Insight:  Present  Psychomotor Activity:  Restlessness, tremulus  Concentration:  Fair  Recall:  [employer] of Knowledge:Fair  Language: Fair  Akathisia:  No  Handed:    AIMS (if indicated):     Assets:  Desire for Improvement  Sleep:  Number of Hours: 1   Musculoskeletal: Strength & Muscle Tone: within normal limits Gait & Station: normal Patient leans: N/A  Current Medications: Current Facility-Administered Medications  Medication Dose Route Frequency Provider Last Rate Last Dose  . acetaminophen (TYLENOL) tablet 650 mg  650 mg Oral Q6H PRN Shuvon Rankin, NP      . alum & mag hydroxide-simeth (MAALOX/MYLANTA) 200-200-20 MG/5ML suspension 30 mL  30 mL Oral Q4H PRN Shuvon Rankin, NP      . benazepril (LOTENSIN) tablet 10 mg  10 mg Oral Daily Sanjuana Kava, NP   10 mg at 06/19/13 1191  . diphenhydrAMINE (BENADRYL) capsule 50 mg  50 mg Oral QHS PRN Kristeen Mans, NP   50 mg at 06/18/13 2159  . feeding supplement (ENSURE COMPLETE) (ENSURE COMPLETE) liquid 237 mL  237 mL Oral TID BM Jeoffrey Massed, RD   237 mL at 06/19/13 1414  . gabapentin (NEURONTIN) capsule 300 mg  300 mg Oral BID Sanjuana Kava, NP   300 mg at 06/19/13 4782  . gabapentin (NEURONTIN) capsule 600 mg  600 mg Oral QHS Rachael FeeIrving A Roseana Rhine, MD   600 mg at 06/18/13 2159  . hydrocortisone cream 1 %   Topical TID Sanjuana KavaAgnes I Nwoko, NP      . hydrOXYzine (ATARAX/VISTARIL) tablet 25 mg  25 mg Oral Q6H PRN Shuvon Rankin, NP   25 mg at 06/19/13 0243  . loperamide (IMODIUM) capsule 2-4 mg  2-4 mg Oral PRN Shuvon Rankin, NP      . loratadine (CLARITIN) tablet 10 mg  10 mg Oral Daily Sanjuana KavaAgnes I Nwoko, NP   10 mg at 06/19/13 [id number]  . LORazepam (ATIVAN) tablet 1 mg  1 mg Oral QID Rachael FeeIrving A Verle Wheeling, MD       Followed by  . [START  ON 06/21/2013] LORazepam (ATIVAN) tablet 1 mg  1 mg Oral TID Rachael FeeIrving A Mikenzie Mccannon, MD       Followed by  . [START ON 06/22/2013] LORazepam (ATIVAN) tablet 1 mg  1 mg Oral BID Rachael FeeIrving A Velora Horstman, MD       Followed by  . [START ON 06/23/2013] LORazepam (ATIVAN) tablet 1 mg  1 mg Oral Daily Rachael FeeIrving A Humbert Morozov, MD      . LORazepam (ATIVAN) tablet 1 mg  1 mg Oral Q6H PRN Rachael FeeIrving A Bricia Taher, MD      . magnesium hydroxide (MILK OF MAGNESIA) suspension 30 mL  30 mL Oral Daily PRN Shuvon Rankin, NP      . multivitamin with minerals tablet 1 tablet  1 tablet Oral Daily Shuvon Rankin, NP   1 tablet at 06/19/13 [phone]  . ondansetron (ZOFRAN-ODT) disintegrating tablet 4 mg  4 mg Oral Q6H PRN Shuvon Rankin, NP      . sertraline (ZOLOFT) tablet 50 mg  50 mg Oral Daily Sanjuana KavaAgnes I Nwoko, NP   50 mg at 06/19/13 [id number]  . thiamine (VITAMIN B-1) tablet 100 mg  100 mg Oral Daily Sanjuana KavaAgnes I Nwoko, NP   100 mg at 06/19/13 [id number]    Lab Results: No results found for this or any previous visit (from the past 48 hour(s)).  Physical Findings: AIMS: Facial and Oral Movements Muscles of Facial Expression: None, normal Lips and Perioral Area: None, normal Jaw: None, normal Tongue: None, normal,Extremity Movements Upper (arms, wrists, hands, fingers): None, normal Lower (legs, knees, ankles, toes): None, normal, Trunk Movements Neck, shoulders, hips: None, normal, Overall Severity Severity of abnormal movements (highest score from questions above): None, normal Incapacitation due to abnormal movements: None, normal Patient's awareness of abnormal movements (rate only patient's report): No Awareness, Dental Status Current problems with teeth and/or dentures?: No Does patient usually wear dentures?: No  CIWA:  CIWA-Ar Total: 11 COWS:     Treatment Plan Summary: Daily contact with patient to assess and evaluate symptoms and progress in treatment Medication management  Plan: Supportive approach/coping skills/relapse prevention           Detox with  Ativan           Reassess and address the co morbidities  Medical Decision Making Problem Points:  Established problem, worsening (2) and Review of psycho-social stressors (1) Data Points:  Review of medication regiment & side effects (2) Review of new medications or change in dosage (2)  I certify that inpatient services furnished can reasonably be expected to improve the patient's condition.   Marijane Trower A 06/19/2013, 3:42 PM

## 2013-06-19 NOTE — [facility] Group Notes (Signed)
[facility] Note   06/19/2013 10:16 AM  Participation Quality:  DID NOT ATTEND-pt withdrawal symptoms severe/unable to come to group.   Smart, American FinancialHeather LCSWA

## 2013-06-19 NOTE — Progress Notes (Signed)
NUTRITION ASSESSMENT  Pt identified as at risk on the Malnutrition Screen Tool  INTERVENTION: 1. Educated patient on the importance of nutrition and encouraged intake of food and beverages. 2. Discussed weight goals. 3. Supplements: Ensure Complete po TID, each supplement provides 350 kcal and 13 grams of protein MVI and thiamine daily  NUTRITION DIAGNOSIS: Unintentional weight loss related to sub-optimal intake as evidenced by pt report.   Goal: Pt to meet >/= 90% of their estimated nutrition needs.  Monitor:  PO intake  Assessment:  Patient admitted for ETOH detox.  Patient was drinking 10 40oz beer daily.  UBW unknown.  Patient's intake is currently poor.  Expect nutritional status and nutritional intake prior to admit was poor based on heavy etoh use.  48 y.o. male  Height: Ht Readings from Last 1 Encounters:  06/17/13 5\' 10"  (1.778 m)    Weight: Wt Readings from Last 1 Encounters:  06/17/13 165 lb (74.844 kg)    Weight Hx: Wt Readings from Last 10 Encounters:  06/17/13 165 lb (74.844 kg)    BMI:  Body mass index is 23.68 kg/(m^2). Pt meets criteria for normal weight for height based on current BMI.  Estimated Nutritional Needs: Kcal: 25-30 kcal/kg Protein: > 1 gram protein/kg Fluid: 1 ml/kcal  Diet Order: General Pt is also offered choice of unit snacks mid-morning and mid-afternoon.  Pt is eating as desired.   Lab results and medications reviewed.   Ronnie Mays, RD, LDN Clinical Inpatient Dietitian Pager:  [phone] Weekend and after hours pager:  [phone]

## 2013-06-19 NOTE — Progress Notes (Signed)
Recreation Therapy Notes  Date: 01.30.2015 Time: 2:45pm Location: [address] Dayroom   Group Topic: Communication, Team Building, Problem Solving  Goal Area(s) Addresses:  Patient will effectively work with peer towards shared goal.  Patient will identify skill used to make activity successful.  Patient will identify how skills used during activity can be used to reach post d/c goals.   Behavioral Response: Did not attend.   Calyn Sivils L Emanuela Runnion, LRT/CTRS  Lazara Grieser L 06/19/2013 4:46 PM 

## 2013-06-19 NOTE — Progress Notes (Signed)
Patient ID: Ronnie Mays, male   DOB: 11/14/1965, 48 y.o.   MRN: [id number]  D: Patient sleeping on approach but woke up when calling name. Reports he did not sleep last night and check sheet indicates only 1 hour of sleep. Very tremulous and sweaty which was very obvious. CIWA - 9 at present. Reports not feeling well and not really thinking about depression since he feels so bad. Currently denies any SI at present. Currently on librium protocol but may be changed to ativan today after physician assesses. Did report he ate breakfast well.  A: Staff will monitor on q 15 minute checks, follow treatment plan, and give meds as ordered. R: Cooperative on the unit.

## 2013-06-19 NOTE — [facility] Group Notes (Signed)
Adult Psychoeducational Group Note  Date:  06/19/2013 Time:  11:52 PM  Group Topic/Focus:  AA Meeting  Participation Level:  Did Not Attend  Participation Quality:  None  Affect:  None  Cognitive:  None  Insight: None  Engagement in Group:  None  Modes of Intervention:  Discussion and Education  Additional Comments:  Ronnie BoundWally did not attend group.  Ronnie RancherLindsay, Ronnie Mays 06/19/2013, 11:52 PM

## 2013-06-19 NOTE — Tx Team (Signed)
Interdisciplinary Treatment Plan Update (Adult)  Date: 06/19/2013   Time Reviewed: 10:57 AM  Progress in Treatment:  Attending groups: Yes  Participating in groups:  Yes  Taking medication as prescribed: Yes  Tolerating medication: Yes  Family/Significant othe contact made: Contact made with pt's wife. SPE completed.  Patient understands diagnosis: Yes, AEB seeking treatment for ETOH detox and mood stabilization.  Discussing patient identified problems/goals with staff: Yes  Medical problems stabilized or resolved: Yes  Denies suicidal/homicidal ideation: Yes during admission/self report.  Patient has not harmed self or Others: Yes  New problem(s) identified:  Discharge Plan or Barriers: Pt [facility]. [facility] for med management.  Additional comments: This is an admission assessment for this 48 year old Caucasian male. Mr. Trudie ReedBurrell appears older than stated age. Admitted [facility] ED with complaints of increased alcohol consumption, intoxication and withdrawal symptoms. Patient reports, "My wife took me to the hospital the day before yesterday. I had gone to my psychiatrist at [facility] [place], [place], but he asked me to come to the hospital instead. He thought that I needed alcohol detox. I'm an alcoholic. Drinking heavily since the age of 89. I drink 10 of 40 once beer daily. I like to drink and I drink because I like it. Some of the reasons for my drinking could be habit as well. My longest sobriety is 2 weeks and that was 2-3 years ago. I have been to the [facility] treatment [facility] for my alcoholism about last year. This time, I planned on going to a long term treatment center after discharge from here. I have history of alcohol withdrawal seizures, the latest was last Tuesday afternoon at my doctors office. I'm feeling a lot of the shakes and cramping now. I feel depressed at #6, but I'm not suicidal. I do hear voices and see  shadows when withdrawing from alcohol".  Reason for Continuation of Hospitalization: Librium taper-withdrawals Mood stabilization Medication management  Estimated length of stay: 3-4 days  For review of initial/current patient goals, please see plan of care.  Attendees:  Patient:    Family:    Physician: Geoffery LyonsIrving Lugo MD 06/19/2013 10:56 AM   Nursing: Lowanda FosterBrittany RN  06/19/2013 10:57 AM   Clinical Social Worker Loyd Salvador Smart, LCSWA  06/19/2013 10:56 AM   Other: Victorino DikeJennifer RN  06/19/2013 10:56 AM   Other:    Other: Darden DatesJennifer C. Nurse CM  06/19/2013 10:56 AM   Other:    Scribe for Treatment Team:  The Sherwin-WilliamsHeather Smart LCSWA 06/19/2013 10:57 AM

## 2013-06-20 DIAGNOSIS — F322 Major depressive disorder, single episode, severe without psychotic features: Secondary | ICD-10-CM

## 2013-06-20 DIAGNOSIS — F1994 Other psychoactive substance use, unspecified with psychoactive substance-induced mood disorder: Secondary | ICD-10-CM

## 2013-06-20 NOTE — Progress Notes (Signed)
D. Pt has been in room for much of the evening, pt unable to attend various meetings at this time. Pt is tremulous, diaphoretic, having episodes of diarrhea and physically unable to tolerate group sessions due to his withdrawals. Pt has received fluids and meals provided to pt in his room and support care provided throughout the evening. Pt has received all medications without incident. A. Support and encouragement provided. R. Safety maintained, will continue to monitor.

## 2013-06-20 NOTE — Progress Notes (Signed)
Psychoeducational Group Note  Date:  06/20/2013 Time: 2100   Group Topic/Focus:  wrap up group  Participation Level: Did Not Attend  Participation Quality:  Not Applicable  Affect:  Not Applicable  Cognitive:  Not Applicable  Insight:  Not Applicable  Engagement in Group: Not Applicable  Additional Comments:  Pt remained in bed during group time due to heavy withdrawal.  Shelah LewandowskySquires, Yolette Hastings Carol 06/20/2013, 11:30 PM

## 2013-06-20 NOTE — Progress Notes (Signed)
Adult Psychoeducational Group Note  Date:  06/20/2013 Time:  0900 am  Group Topic/Focus:  Coping With Mental Health Crisis:   The purpose of this group is to help patients identify strategies for coping with mental health crisis.  Group discusses possible causes of crisis and ways to manage them effectively.  Participation Level:  Did Not Attend   Cranford MonBeaudry, Annalyse Langlais Evans 06/20/2013, 1:02 PM

## 2013-06-20 NOTE — [facility] Group Notes (Signed)
[facility] Group Notes:  (Nursing/MHT/Case Management/Adjunct)  Date:  06/20/2013  Time:  3:41 PM  Type of Therapy:   Did not Attend  Participation Level:   Did not Attend  Participation Quality:   Did not Attend  Affect:   Did not Attend  Cognitive:   Did not Attend  Insight:   Did not Attend  Engagement in Group:   Did not Attend  Modes of Intervention:   Did not Attend  Summary of Progress/Problems: Self Esteem group. Patient having a a lot of withdrawals and has not been well enough to attend group.  Manuela Schwartzritchett, Braylon Lemmons [facility] 06/20/2013, 3:41 PM

## 2013-06-20 NOTE — [facility] Group Notes (Signed)
[facility] Group Notes: (Clinical Social Work)   06/20/2013      Type of Therapy:  Group Therapy   Participation Level:  Did Not Attend    Ambrose MantleMareida Grossman-Orr, LCSW 06/20/2013, 12:49 PM

## 2013-06-20 NOTE — Progress Notes (Signed)
D.  Pt very tremulous on approach, did not attend evening wrap up group due to withdrawal.  Pt spoke to MHT about leaving due to job responsibility but it was unclear at this time if this was real or perceived concern.  Denies SI/HI/hallucinations at this time.  States he must leave tomorrow due to the fact that he is losing money daily being here.  A.  Support and encouragement offered, MHT spoke to Pt about needing to see doctor and be cleared before he could be discharged.  R. Pt remains safe and somewhat confused at times on unit.  Will continue to monitor.

## 2013-06-20 NOTE — Progress Notes (Signed)
Patient ID: Ronnie Mays, male   DOB: Jan 26, 1966, 48 y.o.   MRN: [id number]  D: Patient pleasant this am but had some confusion. Currently on ativan protocol for detox. Patient needed some redirection this am when he was looking for his keys and he called wife to come pick him up. Reporting that he needed to go home to take care of some things. Disoriented to place/ time this am but clearer at lunch. Confusion comes and goes. Staying in bed mostly but up walking in hall some today. Gait a little unsteady but patient was given ativan an hour before. Reported he felt drunk so I gave next dose at 1pm instead of noon. Tremors are a little better than yesterday. He says he feels physically better today. NP notified of confusion from the am. If patient becomes worse we will place on 1:1 monitoring. A: Staff will monitor on q 15 minute checks, follow treatment plan, and give meds. R: Cooperative at present.

## 2013-06-20 NOTE — Progress Notes (Signed)
[facility] MD Progress Note  06/20/2013 3:35 PM Evo Aderman  MRN:  [id number] Subjective:  Zymarion continues to have a hard time with his detox. He is having sweats, tremors, restlessness, insomnia and slight confusion (noted from nursing staff). He is observed lying in his bed, conversation is minimal.States this is the worst withdrawal he has had. He is wanting to go to rehab as states he is not going to be able to make if he does not get more help Diagnosis:   DSM5: Schizophrenia Disorders:  none Obsessive-Compulsive Disorders:  none Trauma-Stressor Disorders:  none Substance/Addictive Disorders:  Alcohol Related Disorder - Severe (303.90) Depressive Disorders:  Major Depressive Disorder - Severe (296.23) Total Time spent with patient: 30 minutes  Axis I: Substance Induced Mood Disorder  ADL's:  Intact  Sleep: Poor  Appetite:  Poor  Suicidal Ideation:  Plan:  denies Intent:  denies Means:  denies Homicidal Ideation:  Plan:  denies Intent:  denies Means:  denies AEB (as evidenced by):  Psychiatric Specialty Exam: Physical Exam   Review of Systems  Constitutional: Positive for malaise/fatigue.  Eyes: Negative.   Respiratory: Negative.   Cardiovascular: Negative.   Gastrointestinal: Negative.   Genitourinary: Negative.   Musculoskeletal: Positive for myalgias.  Skin: Negative.   Neurological: Positive for dizziness, tremors, weakness and headaches.  Psychiatric/Behavioral: Positive for substance abuse. The patient is nervous/anxious and has insomnia.     Blood pressure 86/54, pulse 85, temperature 98 F (36.7 C), temperature source Oral, resp. rate 18, height 5\' 10"  (1.778 m), weight 74.844 kg (165 lb).Body mass index is 23.68 kg/(m^2).  General Appearance: Disheveled  Eye Contact::  Minimal  Speech:  Clear and Coherent, Slow and not spontaneous  Volume:  Decreased  Mood:  Anxious, Depressed and not feeling well  Affect:  anxious, "shaky" in pain  Thought Process:   Coherent and Goal Directed  Orientation:  Full (Time, Place, and Person)  Thought Content:  sympotms, worries, concerns  Suicidal Thoughts:  No  Homicidal Thoughts:  No  Memory:  Immediate;   Fair Recent;   Fair Remote;   Fair  Judgement:  Fair  Insight:  Present  Psychomotor Activity:  Restlessness, tremulus  Concentration:  Fair  Recall:  [employer] of Knowledge:Fair  Language: Fair  Akathisia:  No  Handed:    AIMS (if indicated):     Assets:  Desire for Improvement  Sleep:  Number of Hours: 3   Musculoskeletal: Strength & Muscle Tone: within normal limits Gait & Station: normal Patient leans: N/A  Current Medications: Current Facility-Administered Medications  Medication Dose Route Frequency Provider Last Rate Last Dose  . acetaminophen (TYLENOL) tablet 650 mg  650 mg Oral Q6H PRN Shuvon Rankin, NP   650 mg at 06/20/13 0630  . alum & mag hydroxide-simeth (MAALOX/MYLANTA) 200-200-20 MG/5ML suspension 30 mL  30 mL Oral Q4H PRN Shuvon Rankin, NP      . benazepril (LOTENSIN) tablet 10 mg  10 mg Oral Daily Sanjuana Kava, NP   10 mg at 06/20/13 4098  . diphenhydrAMINE (BENADRYL) capsule 50 mg  50 mg Oral QHS PRN Kristeen Mans, NP   50 mg at 06/19/13 2144  . feeding supplement (ENSURE COMPLETE) (ENSURE COMPLETE) liquid 237 mL  237 mL Oral TID BM Jeoffrey Massed, RD   237 mL at 06/20/13 1430  . gabapentin (NEURONTIN) capsule 300 mg  300 mg Oral BID Sanjuana Kava, NP   300 mg at 06/20/13 1191  . gabapentin (  NEURONTIN) capsule 600 mg  600 mg Oral QHS Rachael FeeIrving A Lugo, MD   600 mg at 06/19/13 2143  . hydrocortisone cream 1 %   Topical TID Sanjuana KavaAgnes I Nwoko, NP      . loratadine (CLARITIN) tablet 10 mg  10 mg Oral Daily Sanjuana KavaAgnes I Nwoko, NP   10 mg at 06/20/13 [id number]  . LORazepam (ATIVAN) tablet 1 mg  1 mg Oral QID Rachael FeeIrving A Lugo, MD   1 mg at 06/20/13 1256   Followed by  . [START ON 06/21/2013] LORazepam (ATIVAN) tablet 1 mg  1 mg Oral TID Rachael FeeIrving A Lugo, MD       Followed by  . [START ON 06/22/2013]  LORazepam (ATIVAN) tablet 1 mg  1 mg Oral BID Rachael FeeIrving A Lugo, MD       Followed by  . [START ON 06/23/2013] LORazepam (ATIVAN) tablet 1 mg  1 mg Oral Daily Rachael FeeIrving A Lugo, MD      . LORazepam (ATIVAN) tablet 1 mg  1 mg Oral Q6H PRN Rachael FeeIrving A Lugo, MD   1 mg at 06/20/13 [phone]  . magnesium hydroxide (MILK OF MAGNESIA) suspension 30 mL  30 mL Oral Daily PRN Shuvon Rankin, NP      . multivitamin with minerals tablet 1 tablet  1 tablet Oral Daily Shuvon Rankin, NP   1 tablet at 06/20/13 0823  . sertraline (ZOLOFT) tablet 50 mg  50 mg Oral Daily Sanjuana KavaAgnes I Nwoko, NP   50 mg at 06/20/13 [id number]  . thiamine (VITAMIN B-1) tablet 100 mg  100 mg Oral Daily Sanjuana KavaAgnes I Nwoko, NP   100 mg at 06/20/13 [id number]    Lab Results: No results found for this or any previous visit (from the past 48 hour(s)).  Physical Findings: AIMS: Facial and Oral Movements Muscles of Facial Expression: None, normal Lips and Perioral Area: None, normal Jaw: None, normal Tongue: None, normal,Extremity Movements Upper (arms, wrists, hands, fingers): None, normal Lower (legs, knees, ankles, toes): None, normal, Trunk Movements Neck, shoulders, hips: None, normal, Overall Severity Severity of abnormal movements (highest score from questions above): None, normal Incapacitation due to abnormal movements: None, normal Patient's awareness of abnormal movements (rate only patient's report): No Awareness, Dental Status Current problems with teeth and/or dentures?: No Does patient usually wear dentures?: No  CIWA:  CIWA-Ar Total: 13 COWS:     Treatment Plan Summary: Daily contact with patient to assess and evaluate symptoms and progress in treatment Medication management  Plan: Supportive approach/coping skills/relapse prevention           Detox with Ativan           Reassess and address the co morbidities  Medical Decision Making Problem Points:  Established problem, worsening (2) and Review of psycho-social stressors (1) Data Points:  Review  of medication regiment & side effects (2) Review of new medications or change in dosage (2)  I certify that inpatient services furnished can reasonably be expected to improve the patient's condition.   Malachy ChamberSTARKES, Kriss Ishler S 06/20/2013, 3:35 PM

## 2013-06-21 ENCOUNTER — Inpatient Hospital Stay (HOSPITAL_COMMUNITY)
Admission: AD | Admit: 2013-06-21 | Discharge: 2013-06-26 | DRG: 896 | Disposition: A | Discharge status: Home / Self Care | Payer: No Typology Code available for payment source | Source: Other Acute Inpatient Hospital | Attending: Internal Medicine | Admitting: Internal Medicine

## 2013-06-21 ENCOUNTER — Encounter (HOSPITAL_COMMUNITY): Payer: Self-pay | Admitting: Emergency Medicine

## 2013-06-21 ENCOUNTER — Inpatient Hospital Stay (HOSPITAL_COMMUNITY): Payer: No Typology Code available for payment source

## 2013-06-21 ENCOUNTER — Inpatient Hospital Stay: Admission: AD | Admit: 2013-06-21 | Payer: Self-pay | Admitting: Internal Medicine

## 2013-06-21 DIAGNOSIS — E7220 Disorder of urea cycle metabolism, unspecified: Secondary | ICD-10-CM | POA: Diagnosis present

## 2013-06-21 DIAGNOSIS — R740 Nonspecific elevation of levels of transaminase and lactic acid dehydrogenase [LDH]: Secondary | ICD-10-CM

## 2013-06-21 DIAGNOSIS — F329 Major depressive disorder, single episode, unspecified: Secondary | ICD-10-CM

## 2013-06-21 DIAGNOSIS — F1910 Other psychoactive substance abuse, uncomplicated: Secondary | ICD-10-CM

## 2013-06-21 DIAGNOSIS — F3289 Other specified depressive episodes: Secondary | ICD-10-CM | POA: Diagnosis present

## 2013-06-21 DIAGNOSIS — F10231 Alcohol dependence with withdrawal delirium: Principal | ICD-10-CM | POA: Diagnosis present

## 2013-06-21 DIAGNOSIS — J189 Pneumonia, unspecified organism: Secondary | ICD-10-CM

## 2013-06-21 DIAGNOSIS — E871 Hypo-osmolality and hyponatremia: Secondary | ICD-10-CM

## 2013-06-21 DIAGNOSIS — F10239 Alcohol dependence with withdrawal, unspecified: Secondary | ICD-10-CM | POA: Diagnosis present

## 2013-06-21 DIAGNOSIS — F1020 Alcohol dependence, uncomplicated: Secondary | ICD-10-CM

## 2013-06-21 DIAGNOSIS — F17200 Nicotine dependence, unspecified, uncomplicated: Secondary | ICD-10-CM | POA: Diagnosis present

## 2013-06-21 DIAGNOSIS — Z598 Other problems related to housing and economic circumstances: Secondary | ICD-10-CM

## 2013-06-21 DIAGNOSIS — Z5987 Material hardship due to limited financial resources, not elsewhere classified: Secondary | ICD-10-CM

## 2013-06-21 DIAGNOSIS — F10931 Alcohol use, unspecified with withdrawal delirium: Principal | ICD-10-CM | POA: Diagnosis present

## 2013-06-21 DIAGNOSIS — F10939 Alcohol use, unspecified with withdrawal, unspecified: Secondary | ICD-10-CM

## 2013-06-21 DIAGNOSIS — J690 Pneumonitis due to inhalation of food and vomit: Secondary | ICD-10-CM | POA: Diagnosis present

## 2013-06-21 DIAGNOSIS — I10 Essential (primary) hypertension: Secondary | ICD-10-CM

## 2013-06-21 DIAGNOSIS — I252 Old myocardial infarction: Secondary | ICD-10-CM

## 2013-06-21 DIAGNOSIS — Z8673 Personal history of transient ischemic attack (TIA), and cerebral infarction without residual deficits: Secondary | ICD-10-CM

## 2013-06-21 DIAGNOSIS — R569 Unspecified convulsions: Secondary | ICD-10-CM | POA: Diagnosis present

## 2013-06-21 DIAGNOSIS — R32 Unspecified urinary incontinence: Secondary | ICD-10-CM | POA: Diagnosis present

## 2013-06-21 DIAGNOSIS — R7401 Elevation of levels of liver transaminase levels: Secondary | ICD-10-CM

## 2013-06-21 DIAGNOSIS — F10229 Alcohol dependence with intoxication, unspecified: Secondary | ICD-10-CM | POA: Diagnosis present

## 2013-06-21 DIAGNOSIS — G9340 Encephalopathy, unspecified: Secondary | ICD-10-CM

## 2013-06-21 DIAGNOSIS — F32A Depression, unspecified: Secondary | ICD-10-CM

## 2013-06-21 DIAGNOSIS — Z79899 Other long term (current) drug therapy: Secondary | ICD-10-CM

## 2013-06-21 DIAGNOSIS — Z888 Allergy status to other drugs, medicaments and biological substances status: Secondary | ICD-10-CM

## 2013-06-21 DIAGNOSIS — K7030 Alcoholic cirrhosis of liver without ascites: Secondary | ICD-10-CM | POA: Diagnosis present

## 2013-06-21 DIAGNOSIS — Z23 Encounter for immunization: Secondary | ICD-10-CM

## 2013-06-21 LAB — COMPREHENSIVE METABOLIC PANEL
ALBUMIN: 3.4 g/dL — AB (ref 3.5–5.2)
ALBUMIN: 3.4 g/dL — AB (ref 3.5–5.2)
ALT: 99 U/L — ABNORMAL HIGH (ref 0–53)
ALT: 102 U/L — AB (ref 0–53)
ALT: 95 U/L — ABNORMAL HIGH (ref 0–53)
AST: 151 U/L — AB (ref 0–37)
AST: 158 U/L — ABNORMAL HIGH (ref 0–37)
AST: 142 U/L — ABNORMAL HIGH (ref 0–37)
Albumin: 3.7 g/dL (ref 3.5–5.2)
Alkaline Phosphatase: 140 U/L — ABNORMAL HIGH (ref 39–117)
Alkaline Phosphatase: 125 U/L — ABNORMAL HIGH (ref 39–117)
Alkaline Phosphatase: 115 U/L (ref 39–117)
BUN: 15 mg/dL (ref 6–23)
BUN: 13 mg/dL (ref 6–23)
BUN: 12 mg/dL (ref 6–23)
CALCIUM: 9.4 mg/dL (ref 8.4–10.5)
CALCIUM: 9.1 mg/dL (ref 8.4–10.5)
CO2: 24 mEq/L (ref 19–32)
CO2: 21 meq/L (ref 19–32)
CO2: 24 mEq/L (ref 19–32)
CREATININE: 0.66 mg/dL (ref 0.50–1.35)
Calcium: 9.3 mg/dL (ref 8.4–10.5)
Chloride: 97 mEq/L (ref 96–112)
Chloride: 99 mEq/L (ref 96–112)
Chloride: 101 mEq/L (ref 96–112)
Creatinine, Ser: 0.67 mg/dL (ref 0.50–1.35)
Creatinine, Ser: 0.66 mg/dL (ref 0.50–1.35)
GFR calc Af Amer: >90 mL/min (ref >90)
GFR calc Af Amer: >90 mL/min (ref >90)
GFR calc non Af Amer: >90 mL/min (ref >90)
GFR calc non Af Amer: >90 mL/min (ref >90)
GFR calc non Af Amer: >90 mL/min (ref >90)
GLUCOSE: 103 mg/dL — AB (ref 70–99)
Glucose, Bld: 87 mg/dL (ref 70–99)
Glucose, Bld: 92 mg/dL (ref 70–99)
POTASSIUM: 4.6 meq/L (ref 3.7–5.3)
Potassium: 4.5 mEq/L (ref 3.7–5.3)
Potassium: 4.2 mEq/L (ref 3.7–5.3)
SODIUM: 133 meq/L — AB (ref 137–147)
Sodium: 131 mEq/L — ABNORMAL LOW (ref 137–147)
Sodium: 136 mEq/L — ABNORMAL LOW (ref 137–147)
TOTAL PROTEIN: 8.2 g/dL (ref 6.0–8.3)
TOTAL PROTEIN: 8.7 g/dL — AB (ref 6.0–8.3)
TOTAL PROTEIN: 8.2 g/dL (ref 6.0–8.3)
Total Bilirubin: 2.5 mg/dL — ABNORMAL HIGH (ref 0.3–1.2)
Total Bilirubin: 2.7 mg/dL — ABNORMAL HIGH (ref 0.3–1.2)
Total Bilirubin: 2.6 mg/dL — ABNORMAL HIGH (ref 0.3–1.2)

## 2013-06-21 LAB — GLUCOSE, CAPILLARY: Glucose-Capillary: 98 mg/dL (ref 70–99)

## 2013-06-21 LAB — RAPID URINE DRUG SCREEN, HOSP PERFORMED
Amphetamines: NOT DETECTED
BENZODIAZEPINES: POSITIVE — AB
Barbiturates: NOT DETECTED
COCAINE: NOT DETECTED
OPIATES: NOT DETECTED
Tetrahydrocannabinol: NOT DETECTED

## 2013-06-21 LAB — CBC WITH DIFFERENTIAL/PLATELET
Basophils Absolute: 0.1 10*3/uL (ref 0.0–0.1)
Basophils Relative: 2 % — ABNORMAL HIGH (ref 0–1)
EOS ABS: 0.1 10*3/uL (ref 0.0–0.7)
Eosinophils Relative: 3 % (ref 0–5)
HEMATOCRIT: 38.8 % — AB (ref 39.0–52.0)
Hemoglobin: 13 g/dL (ref 13.0–17.0)
LYMPHS PCT: 18 % (ref 12–46)
Lymphs Abs: 0.6 10*3/uL — ABNORMAL LOW (ref 0.7–4.0)
MCH: 34.9 pg — ABNORMAL HIGH (ref 26.0–34.0)
MCHC: 33.5 g/dL (ref 30.0–36.0)
MCV: 104.3 fL — ABNORMAL HIGH (ref 78.0–100.0)
MONO ABS: 0.8 10*3/uL (ref 0.1–1.0)
Monocytes Relative: 27 % — ABNORMAL HIGH (ref 3–12)
NEUTROS ABS: 1.5 10*3/uL — AB (ref 1.7–7.7)
Neutrophils Relative %: 50 % (ref 43–77)
Platelets: 59 10*3/uL — ABNORMAL LOW (ref 150–400)
RBC: 3.72 MIL/uL — ABNORMAL LOW (ref 4.22–5.81)
RDW: 14.4 % (ref 11.5–15.5)
WBC: 3.1 10*3/uL — ABNORMAL LOW (ref 4.0–10.5)

## 2013-06-21 LAB — URINALYSIS, ROUTINE W REFLEX MICROSCOPIC
BILIRUBIN URINE: NEGATIVE
GLUCOSE, UA: NEGATIVE mg/dL
Hgb urine dipstick: NEGATIVE
KETONES UR: NEGATIVE mg/dL
Leukocytes, UA: NEGATIVE
NITRITE: NEGATIVE
PH: 6.5 (ref 5.0–8.0)
Protein, ur: NEGATIVE mg/dL
SPECIFIC GRAVITY, URINE: 1.01 (ref 1.005–1.030)
Urobilinogen, UA: >8 mg/dL — ABNORMAL HIGH (ref 0.0–1.0)

## 2013-06-21 LAB — AMMONIA
AMMONIA: 82 umol/L — AB (ref 11–60)
AMMONIA: 40 umol/L (ref 11–60)

## 2013-06-21 LAB — PROTIME-INR
INR: 1.25 (ref 0.00–1.49)
PROTHROMBIN TIME: 15.4 s — AB (ref 11.6–15.2)

## 2013-06-21 LAB — ETHANOL

## 2013-06-21 MED ORDER — SODIUM CHLORIDE 0.9 % IV SOLN: 1000.0000 mL | INTRAVENOUS | Status: DC

## 2013-06-21 MED ORDER — LORAZEPAM 2 MG/ML IJ SOLN: 1.0000 mg | Freq: Four times a day (QID) | INTRAMUSCULAR | Status: DC | PRN

## 2013-06-21 MED ORDER — LORAZEPAM 1 MG PO TABS: 0.0000 mg | ORAL_TABLET | Freq: Two times a day (BID) | ORAL | Status: DC

## 2013-06-21 MED ORDER — LORAZEPAM 1 MG PO TABS: 1.0000 mg | ORAL_TABLET | Freq: Every day | ORAL | Status: DC

## 2013-06-21 MED ORDER — LACTULOSE 10 GM/15ML PO SOLN
20.0000 g | Freq: Three times a day (TID) | ORAL | Status: DC
  Administered 2013-06-21 (×2): 20 g via ORAL
  Filled 2013-06-21 (×6): qty 30

## 2013-06-21 MED ORDER — LORAZEPAM 1 MG PO TABS
1.0000 mg | ORAL_TABLET | Freq: Four times a day (QID) | ORAL | Status: DC
  Administered 2013-06-21 (×2): 1 mg via ORAL
  Filled 2013-06-21 (×2): qty 1

## 2013-06-21 MED ORDER — LORAZEPAM 1 MG PO TABS
0.0000 mg | ORAL_TABLET | Freq: Four times a day (QID) | ORAL | Status: DC
  Administered 2013-06-21: 2 mg via ORAL
  Filled 2013-06-21: qty 2

## 2013-06-21 MED ORDER — DEXTROSE 5 % IV SOLN
500.0000 mg | Freq: Once | INTRAVENOUS | Status: AC
  Administered 2013-06-21: 500 mg via INTRAVENOUS

## 2013-06-21 MED ORDER — LORAZEPAM 1 MG PO TABS: 1.0000 mg | ORAL_TABLET | Freq: Four times a day (QID) | ORAL | Status: DC | PRN

## 2013-06-21 MED ORDER — SODIUM CHLORIDE 0.9 % IV SOLN
1000.0000 mL | Freq: Once | INTRAVENOUS | Status: AC
  Administered 2013-06-21: 1000 mL via INTRAVENOUS

## 2013-06-21 MED ORDER — PIPERACILLIN-TAZOBACTAM 3.375 G IVPB
3.3750 g | Freq: Once | INTRAVENOUS | Status: DC
  Administered 2013-06-22: 3.375 g via INTRAVENOUS
  Filled 2013-06-21: qty 50

## 2013-06-21 MED ORDER — LORAZEPAM 1 MG PO TABS: 1.0000 mg | ORAL_TABLET | Freq: Two times a day (BID) | ORAL | Status: DC

## 2013-06-21 MED ORDER — DEXTROSE 5 % IV SOLN
1.0000 g | Freq: Once | INTRAVENOUS | Status: AC
  Administered 2013-06-21: 1 g via INTRAVENOUS
  Filled 2013-06-21: qty 10

## 2013-06-21 NOTE — ED Notes (Signed)
Pt arrived to the ED with a complaint of withdrawal symptoms after stopping alcohol.  [facility] and was sent here after it was determined that he was having auditory and visual hallucinations.  Pt also is having altered mental status.  According to EMS Nursing [facility] stated pt ammonia level is high.

## 2013-06-21 NOTE — Progress Notes (Signed)
Patient ID: Ronnie Mays, male   DOB: Jan 11, 1966, 48 y.o.   MRN: [id number] S Received calll from floorpt had become confused and incoordinated around 3:30 am.Pt reportedly Etoh W/D in bed past 2 days on Ativan due to alcoholic liver disease.No labs since 1/27.  O-AST 244 ALT 115 Albumin wnl Total Bili 2.0 (1.2 wnl) Urine Bili negative. Macrocytic/hyperchromic anemia ,ETOH PTA 325.No focal neuromuscular loss of function per nurse  A-Probably "lost his legs" on Ativan protocol ? but need to check CMP and Ammonia level  P-Stat labs.Make pt 1:1 for now    6:27 am-Stat labs not up yet-will need to be reviewd by dayshift
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
## 2013-06-21 NOTE — Progress Notes (Addendum)
Pt has started coming out into the hallway every twenty minutes. Pt is very tremulous and is off balance while shuffling his feet. Pt is disoriented and confused. Pt walked the hall reporting he was looking for his five gallon bucket of tools. Pt was oriented to place and time by this Clinical research associatewriter and then responded "my tools must be in my car". Pt was escorted back to his bed. Pt also asks about persons by name whom are not in the building.

## 2013-06-21 NOTE — Progress Notes (Signed)
[facility] Group Notes:  (Nursing/MHT/Case Management/Adjunct)  Date:  06/21/2013  Time:  10:23 AM  Type of Therapy:  Psychoeducational Skills  Participation Level:  Did not attend  Modes of Intervention:  Activity  Summary of Progress/Problems: After playing an activity using the Therapeutic Ball, pts were asked to identify one thing they have learned while here at the hospital. Pt was going through bad withdrawals and stayed in bed.  Caswell CorwinOwen, Pinkie Manger C 06/21/2013, 10:23 AM

## 2013-06-21 NOTE — Progress Notes (Signed)
[facility] Group Notes:  (Nursing/MHT/Case Management/Adjunct)  Date:  06/21/2013  Time:  6:39 PM  Type of Therapy:  Psychoeducational Skills  Participation Level:  Did not attend  Modes of Intervention:  Activity  Summary of Progress/Problems: Pts played an activity of Pictionary using coping skills. Pts were asked one coping skill they could use once they leave the hospital that works for them. Pt has been going through withdrawals and is very confused and did not attend group.  Caswell CorwinOwen, Amanada Philbrick C 06/21/2013, 6:39 PM
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
## 2013-06-21 NOTE — ED Notes (Signed)
Bed: WA17 Expected date: 06/21/13 Expected time: 8:08 PM Means of arrival: Ambulance Comments: Alcohol withdrawal

## 2013-06-21 NOTE — [facility] Group Notes (Signed)
[facility] Group Notes: (Clinical Social Work)   06/21/2013      Type of Therapy:  Group Therapy   Participation Level:  Did Not Attend    Ambrose MantleMareida Grossman-Orr, LCSW 06/21/2013, 12:16 PM

## 2013-06-21 NOTE — Progress Notes (Signed)
Pt progressively confused during night, kept getting up and speaking of going home.  Extremely tremulous.  Ronnie Mornharles Kober, PA notified and [facility] received [facility] (CMP, Ammonia level).  [facility].  No acute distress, but Pt continues to say "after this I can go home, right?"  Re-oriented Pt to reason being sent and fact that he must see MD and be more stable before discharge.  Pt with minimal communication and unclear understanding.  Will continue to monitor for safety upon return.

## 2013-06-21 NOTE — Progress Notes (Signed)
D.  Pt. Has a flat affect, anxious mood.  Pt. Confused and disoriented to time and place.  Compliant with medication and reports "I am not taking any cocaine."  Gait unsteady.  Assisted by MHT.  Pt. Is eating but reports "I can't eat but a little bit at a time."   A.  Encouraged pt. To eat and drink fluids.  Medications given as ordered. R.  Pt. Remains safe and is redirectable.

## 2013-06-21 NOTE — Progress Notes (Signed)
[facility] MD Progress Note  06/21/2013 3:35 PM Ronnie Mays  MRN:  [id number] Subjective:  Ronnie Mays continues to have a hard time with his detox. He is having sweats, tremors, restlessness, insomnia, unsteady gait and confusion. Due to his deterioration in his condition and orientation he was placed on 1:1 observation. He is observed lying in his bed, then upon conversing with him he attempts to get up and states he is looking for his cigarettes. He gets up out of bed and puts his coat on, in an attempt to go to the store to purchase cigarettes. He proceeds to ask staff if he can use our cell phone so that he may go to the store. He is oriented to self, disoriented to time and place. He is able to follow verbal commands, but continues to have disorganized thoughts.   Diagnosis:   DSM5: Schizophrenia Disorders:  none Obsessive-Compulsive Disorders:  none Trauma-Stressor Disorders:  none Substance/Addictive Disorders:  Alcohol Related Disorder - Severe (303.90) Depressive Disorders:  Major Depressive Disorder - Severe (296.23) Total Time spent with patient: 30 minutes  Axis I: Substance Induced Mood Disorder  ADL's:  Intact  Sleep: Poor  Appetite:  Poor  Suicidal Ideation:  Plan:  denies Intent:  denies Means:  denies Homicidal Ideation:  Plan:  denies Intent:  denies Means:  denies AEB (as evidenced by):  Psychiatric Specialty Exam: Physical Exam  Constitutional: He appears distressed.  Neurological: He is disoriented (1:1). Coordination abnormal.  Skin: Skin is warm and dry.  Psychiatric: His mood appears anxious. His speech is delayed and slurred. He is agitated and hyperactive. Cognition and memory are impaired. He expresses inappropriate judgment.    Review of Systems  Constitutional: Positive for malaise/fatigue.  Eyes: Negative.   Respiratory: Negative.   Cardiovascular: Negative.   Gastrointestinal: Negative.   Genitourinary: Negative.   Musculoskeletal: Positive for  myalgias.  Skin: Negative.   Neurological: Positive for dizziness, tremors, weakness and headaches.  Psychiatric/Behavioral: Positive for substance abuse. The patient is nervous/anxious and has insomnia.     Blood pressure 125/79, pulse 76, temperature 98.2 F (36.8 C), temperature source Oral, resp. rate 16, height $RemoveBe'5\' 10"'BomQJxmdc$  (1.778 m), weight 74.844 kg (165 lb).Body mass index is 23.68 kg/(m^2).  General Appearance: Disheveled  Eye Contact::  Minimal  Speech:  Garbled, Slow and not spontaneous  Volume:  Decreased  Mood:  Angry, Anxious, Depressed, Irritable and not feeling well  Affect:  anxious, "shaky" in pain  Thought Process:  Disorganized and Irrelevant  Orientation:  Other:  self  Thought Content:  sympotms, worries, concerns  Suicidal Thoughts:  No  Homicidal Thoughts:  No  Memory:  Immediate;   Poor Recent;   Poor Remote;   Poor  Judgement:  Impaired  Insight:  Lacking and Present  Psychomotor Activity:  Restlessness, Shuffling Gait and Tremor, tremulus  Concentration:  Poor  Recall:  Poor  Fund of Knowledge:Fair  Language: Fair  Akathisia:  No  Handed:    AIMS (if indicated):     Assets:  Desire for Improvement  Sleep:  Number of Hours: 0.5   Musculoskeletal: Strength & Muscle Tone: decreased Gait & Station: unsteady Patient leans: Right and N/A  Current Medications: Current Facility-Administered Medications  Medication Dose Route Frequency Provider Last Rate Last Dose  . acetaminophen (TYLENOL) tablet 650 mg  650 mg Oral Q6H PRN Shuvon Rankin, NP   650 mg at 06/20/13 0630  . alum & mag hydroxide-simeth (MAALOX/MYLANTA) 200-200-20 MG/5ML suspension 30 mL  30 mL  Oral Q4H PRN Shuvon Rankin, NP      . diphenhydrAMINE (BENADRYL) capsule 50 mg  50 mg Oral QHS PRN Lurena Nida, NP   50 mg at 06/20/13 2115  . feeding supplement (ENSURE COMPLETE) (ENSURE COMPLETE) liquid 237 mL  237 mL Oral TID BM Darrol Jump, RD   237 mL at 06/20/13 2117  . gabapentin (NEURONTIN)  capsule 300 mg  300 mg Oral BID Encarnacion Slates, NP   300 mg at 06/21/13 6295  . gabapentin (NEURONTIN) capsule 600 mg  600 mg Oral QHS Nicholaus Bloom, MD   600 mg at 06/20/13 2115  . hydrocortisone cream 1 %   Topical TID Encarnacion Slates, NP      . lactulose (CHRONULAC) 10 GM/15ML solution 20 g  20 g Oral TID Domenic Polite, MD   20 g at 06/21/13 1225  . loratadine (CLARITIN) tablet 10 mg  10 mg Oral Daily Encarnacion Slates, NP   10 mg at 06/21/13 2841  . LORazepam (ATIVAN) tablet 1 mg  1 mg Oral Q6H PRN Nicholaus Bloom, MD   1 mg at 06/21/13 0601  . LORazepam (ATIVAN) tablet 1 mg  1 mg Oral QID Domenic Polite, MD   1 mg at 06/21/13 1224   Followed by  . [START ON 06/22/2013] LORazepam (ATIVAN) tablet 1 mg  1 mg Oral BID Domenic Polite, MD       Followed by  . [START ON 06/23/2013] LORazepam (ATIVAN) tablet 1 mg  1 mg Oral Daily Domenic Polite, MD      . magnesium hydroxide (MILK OF MAGNESIA) suspension 30 mL  30 mL Oral Daily PRN Shuvon Rankin, NP      . multivitamin with minerals tablet 1 tablet  1 tablet Oral Daily Shuvon Rankin, NP   1 tablet at 06/21/13 3244  . sertraline (ZOLOFT) tablet 50 mg  50 mg Oral Daily Encarnacion Slates, NP   50 mg at 06/21/13 0102  . thiamine (VITAMIN B-1) tablet 100 mg  100 mg Oral Daily Encarnacion Slates, NP   100 mg at 06/21/13 7253    Lab Results:  Results for orders placed during the hospital encounter of 06/17/13 (from the past 48 hour(s))  COMPREHENSIVE METABOLIC PANEL     Status: Abnormal   Collection Time    06/21/13  4:11 AM      Result Value Range   Sodium 131 (*) 137 - 147 mEq/L   Potassium 4.6  3.7 - 5.3 mEq/L   Chloride 97  96 - 112 mEq/L   CO2 24  19 - 32 mEq/L   Glucose, Bld 87  70 - 99 mg/dL   BUN 15  6 - 23 mg/dL   Creatinine, Ser 0.67  0.50 - 1.35 mg/dL   Calcium 9.3  8.4 - 10.5 mg/dL   Total Protein 8.2  6.0 - 8.3 g/dL   Albumin 3.4 (*) 3.5 - 5.2 g/dL   AST 151 (*) 0 - 37 U/L   ALT 99 (*) 0 - 53 U/L   Alkaline Phosphatase 140 (*) 39 - 117 U/L   Total  Bilirubin 2.5 (*) 0.3 - 1.2 mg/dL   GFR calc non Af Amer >90  >90 mL/min   GFR calc Af Amer >90  >90 mL/min   Comment: (NOTE)     The eGFR has been calculated using the CKD EPI equation.     This calculation has not been validated in all clinical situations.  eGFR's persistently <90 mL/min signify possible Chronic Kidney     Disease.     Performed at [facility]     Status: Abnormal   Collection Time    06/21/13  4:11 AM      Result Value Range   Ammonia 82 (*) 11 - 60 umol/L   Comment: Performed at [facility]    Physical Findings: AIMS: Facial and Oral Movements Muscles of Facial Expression: None, normal Lips and Perioral Area: None, normal Jaw: None, normal Tongue: None, normal,Extremity Movements Upper (arms, wrists, hands, fingers): None, normal Lower (legs, knees, ankles, toes): None, normal, Trunk Movements Neck, shoulders, hips: None, normal, Overall Severity Severity of abnormal movements (highest score from questions above): None, normal Incapacitation due to abnormal movements: None, normal Patient's awareness of abnormal movements (rate only patient's report): No Awareness, Dental Status Current problems with teeth and/or dentures?: No Does patient usually wear dentures?: No  CIWA:  CIWA-Ar Total: 14 COWS:     Treatment Plan Summary: Daily contact with patient to assess and evaluate symptoms and progress in treatment Medication management  Plan: Supportive approach/coping skills/relapse prevention           Detox with Ativan           Reassess and address the co morbidities Medical consult obtained d/t increasing Ammonia levels, and confusion. Discussed medical condition with Dr. Broadus John; will increase ativan and add Lactulose. If patient becomes severely agitated, notify on-call hospital.  Initiate 1:1 observation for safety and close observation.   Medical Decision Making Problem Points:  Established problem,  worsening (2) and Review of psycho-social stressors (1) Data Points:  Review of medication regiment & side effects (2) Review of new medications or change in dosage (2)  I certify that inpatient services furnished can reasonably be expected to improve the patient's condition.   Priscille Loveless S 06/21/2013, 3:35 PM

## 2013-06-21 NOTE — [facility] Group Notes (Signed)
[facility] Group Notes:  (Nursing/MHT/Case Management/Adjunct)  Date:  06/21/2013  Time:  1330  Type of Therapy:  Psychoeducational Skills  Participation Level:  Did Not Attend  Participation Quality:  Did not attend  Affect:  Did not attend  Cognitive:  N/A  Insight:  N/A  Engagement in Group:  Did not attend  Modes of Intervention:  Did not attend  Summary of Progress/Problems: Pt. Did not attend group.   Jacari Kirsten Dawkins 06/21/2013, 3:44 PM
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
## 2013-06-21 NOTE — Progress Notes (Signed)
Note 1:1  D.  Pt. Resting quietly  in bed intermittently dosing.  Oriented to person but not place and time.  Voided and bowel movement x 1.  Eating fruit.  Compliant with medications.  Denies pain. A.  1:1 continued for safety.  Pt. Encouraged to eat and drink fluids. R.  Pt. Remains safe.

## 2013-06-21 NOTE — Patient Care Conference (Signed)
Patient presenting as confused and agitated. Patient positive for tremors and restlessness. Patient alert to only self. Patient disoriented to place time and situation. The patient kneels to the floor repeatedly and states "I'm looking for my wallet and teeth." The patient is redirectable with both MHT and RN intervention. RN notified NP and MD of patient's behaviors, thought processes and lab values. No new orders were given. Will continue to monitor patient for safety.

## 2013-06-22 ENCOUNTER — Encounter (HOSPITAL_COMMUNITY): Payer: Self-pay

## 2013-06-22 DIAGNOSIS — F3289 Other specified depressive episodes: Secondary | ICD-10-CM

## 2013-06-22 DIAGNOSIS — R740 Nonspecific elevation of levels of transaminase and lactic acid dehydrogenase [LDH]: Secondary | ICD-10-CM

## 2013-06-22 DIAGNOSIS — F10939 Alcohol use, unspecified with withdrawal, unspecified: Secondary | ICD-10-CM

## 2013-06-22 DIAGNOSIS — I10 Essential (primary) hypertension: Secondary | ICD-10-CM | POA: Diagnosis present

## 2013-06-22 DIAGNOSIS — F1910 Other psychoactive substance abuse, uncomplicated: Secondary | ICD-10-CM

## 2013-06-22 DIAGNOSIS — F10239 Alcohol dependence with withdrawal, unspecified: Secondary | ICD-10-CM

## 2013-06-22 DIAGNOSIS — R7401 Elevation of levels of liver transaminase levels: Secondary | ICD-10-CM | POA: Diagnosis present

## 2013-06-22 DIAGNOSIS — G9340 Encephalopathy, unspecified: Secondary | ICD-10-CM

## 2013-06-22 DIAGNOSIS — F329 Major depressive disorder, single episode, unspecified: Secondary | ICD-10-CM

## 2013-06-22 DIAGNOSIS — R7402 Elevation of levels of lactic acid dehydrogenase (LDH): Secondary | ICD-10-CM

## 2013-06-22 LAB — LEGIONELLA ANTIGEN, URINE: Legionella Antigen, Urine: NEGATIVE

## 2013-06-22 LAB — PROTIME-INR
INR: 1.39 (ref 0.00–1.49)
Prothrombin Time: 16.7 seconds — ABNORMAL HIGH (ref 11.6–15.2)

## 2013-06-22 LAB — CBC WITH DIFFERENTIAL/PLATELET
BASOS ABS: 0.1 10*3/uL (ref 0.0–0.1)
BASOS PCT: 2 % — AB (ref 0–1)
EOS ABS: 0.1 10*3/uL (ref 0.0–0.7)
EOS PCT: 4 % (ref 0–5)
HEMATOCRIT: 38.4 % — AB (ref 39.0–52.0)
Hemoglobin: 12.7 g/dL — ABNORMAL LOW (ref 13.0–17.0)
Lymphocytes Relative: 25 % (ref 12–46)
Lymphs Abs: 0.7 10*3/uL (ref 0.7–4.0)
MCH: 34.8 pg — AB (ref 26.0–34.0)
MCHC: 33.1 g/dL (ref 30.0–36.0)
MCV: 105.2 fL — ABNORMAL HIGH (ref 78.0–100.0)
MONO ABS: 0.7 10*3/uL (ref 0.1–1.0)
Monocytes Relative: 22 % — ABNORMAL HIGH (ref 3–12)
Neutro Abs: 1.4 10*3/uL — ABNORMAL LOW (ref 1.7–7.7)
Neutrophils Relative %: 47 % (ref 43–77)
Platelets: 50 10*3/uL — ABNORMAL LOW (ref 150–400)
RBC: 3.65 MIL/uL — ABNORMAL LOW (ref 4.22–5.81)
RDW: 14.4 % (ref 11.5–15.5)
WBC: 3 10*3/uL — ABNORMAL LOW (ref 4.0–10.5)

## 2013-06-22 LAB — GLUCOSE, CAPILLARY: GLUCOSE-CAPILLARY: 84 mg/dL (ref 70–99)

## 2013-06-22 LAB — COMPREHENSIVE METABOLIC PANEL
ALK PHOS: 106 U/L (ref 39–117)
ALT: 89 U/L — AB (ref 0–53)
AST: 134 U/L — ABNORMAL HIGH (ref 0–37)
Albumin: 3.1 g/dL — ABNORMAL LOW (ref 3.5–5.2)
BUN: 10 mg/dL (ref 6–23)
CO2: 22 meq/L (ref 19–32)
Calcium: 8.1 mg/dL — ABNORMAL LOW (ref 8.4–10.5)
Chloride: 104 mEq/L (ref 96–112)
Creatinine, Ser: 0.57 mg/dL (ref 0.50–1.35)
GFR calc Af Amer: >90 mL/min (ref >90)
Glucose, Bld: 83 mg/dL (ref 70–99)
POTASSIUM: 4.1 meq/L (ref 3.7–5.3)
SODIUM: 136 meq/L — AB (ref 137–147)
TOTAL PROTEIN: 7.5 g/dL (ref 6.0–8.3)
Total Bilirubin: 2.4 mg/dL — ABNORMAL HIGH (ref 0.3–1.2)

## 2013-06-22 LAB — MRSA PCR SCREENING: MRSA by PCR: NEGATIVE

## 2013-06-22 LAB — STREP PNEUMONIAE URINARY ANTIGEN: Strep Pneumo Urinary Antigen: NEGATIVE

## 2013-06-22 LAB — HIV ANTIBODY (ROUTINE TESTING W REFLEX): HIV: NONREACTIVE

## 2013-06-22 LAB — AMMONIA: Ammonia: 43 umol/L (ref 11–60)

## 2013-06-22 MED ORDER — LORAZEPAM 1 MG PO TABS: 1.0000 mg | ORAL_TABLET | Freq: Four times a day (QID) | ORAL | Status: DC | PRN

## 2013-06-22 MED ORDER — LORAZEPAM 2 MG/ML IJ SOLN
0.0000 mg | Freq: Four times a day (QID) | INTRAMUSCULAR | Status: DC
  Administered 2013-06-22 (×2): 2 mg via INTRAVENOUS
  Filled 2013-06-22: qty 1

## 2013-06-22 MED ORDER — LORAZEPAM 2 MG/ML IJ SOLN
2.0000 mg | INTRAMUSCULAR | Status: DC | PRN
  Administered 2013-06-22 – 2013-06-25 (×17): 2 mg via INTRAVENOUS
  Administered 2013-06-25: 1 mg via INTRAVENOUS
  Administered 2013-06-25 (×2): 2 mg via INTRAVENOUS
  Filled 2013-06-22 (×4): qty 1
  Filled 2013-06-22: qty 2
  Filled 2013-06-22 (×15): qty 1

## 2013-06-22 MED ORDER — LACTULOSE 10 GM/15ML PO SOLN
20.0000 g | Freq: Two times a day (BID) | ORAL | Status: DC
  Administered 2013-06-22: 20 g via ORAL
  Filled 2013-06-22 (×3): qty 30

## 2013-06-22 MED ORDER — LORAZEPAM 2 MG/ML IJ SOLN
INTRAMUSCULAR | Status: AC
  Administered 2013-06-22: 2 mg
  Filled 2013-06-22: qty 2

## 2013-06-22 MED ORDER — PNEUMOCOCCAL VAC POLYVALENT 25 MCG/0.5ML IJ INJ
0.5000 mL | INJECTION | INTRAMUSCULAR | Status: AC
  Administered 2013-06-23: 0.5 mL via INTRAMUSCULAR
  Filled 2013-06-22 (×2): qty 0.5

## 2013-06-22 MED ORDER — INFLUENZA VAC SPLIT QUAD 0.5 ML IM SUSP
0.5000 mL | INTRAMUSCULAR | Status: AC
  Administered 2013-06-23: 0.5 mL via INTRAMUSCULAR
  Filled 2013-06-22 (×2): qty 0.5

## 2013-06-22 MED ORDER — METOPROLOL TARTRATE 1 MG/ML IV SOLN
5.0000 mg | Freq: Three times a day (TID) | INTRAVENOUS | Status: DC
  Administered 2013-06-22: 2.5 mg via INTRAVENOUS
  Administered 2013-06-23: 5 mg via INTRAVENOUS
  Filled 2013-06-22 (×5): qty 5

## 2013-06-22 MED ORDER — LORAZEPAM 2 MG/ML IJ SOLN
INTRAMUSCULAR | Status: AC
  Filled 2013-06-22: qty 1

## 2013-06-22 MED ORDER — PANTOPRAZOLE SODIUM 40 MG PO TBEC
40.0000 mg | DELAYED_RELEASE_TABLET | Freq: Every day | ORAL | Status: DC
  Administered 2013-06-23: 40 mg via ORAL
  Filled 2013-06-22 (×2): qty 1

## 2013-06-22 MED ORDER — HYDRALAZINE HCL 20 MG/ML IJ SOLN
10.0000 mg | INTRAMUSCULAR | Status: DC | PRN
  Administered 2013-06-22 – 2013-06-25 (×2): 20 mg via INTRAVENOUS
  Filled 2013-06-22 (×2): qty 1

## 2013-06-22 MED ORDER — IPRATROPIUM-ALBUTEROL 0.5-2.5 (3) MG/3ML IN SOLN: 3.0000 mL | Freq: Four times a day (QID) | RESPIRATORY_TRACT | Status: DC | PRN

## 2013-06-22 MED ORDER — PIPERACILLIN-TAZOBACTAM 3.375 G IVPB
3.3750 g | Freq: Three times a day (TID) | INTRAVENOUS | Status: AC
  Administered 2013-06-22 – 2013-06-26 (×12): 3.375 g via INTRAVENOUS
  Filled 2013-06-22 (×13): qty 50

## 2013-06-22 MED ORDER — DEXMEDETOMIDINE HCL IN NACL 200 MCG/50ML IV SOLN
0.2000 ug/kg/h | INTRAVENOUS | Status: AC
  Administered 2013-06-22: 0.2 ug/kg/h via INTRAVENOUS
  Administered 2013-06-22: 1.2 ug/kg/h via INTRAVENOUS
  Administered 2013-06-22 (×2): 0.4 ug/kg/h via INTRAVENOUS
  Filled 2013-06-22 (×5): qty 50

## 2013-06-22 MED ORDER — ADULT MULTIVITAMIN W/MINERALS CH
1.0000 | ORAL_TABLET | Freq: Every day | ORAL | Status: DC
  Administered 2013-06-23 – 2013-06-26 (×4): 1 via ORAL
  Filled 2013-06-22 (×5): qty 1

## 2013-06-22 MED ORDER — NICOTINE 21 MG/24HR TD PT24
21.0000 mg | MEDICATED_PATCH | Freq: Every day | TRANSDERMAL | Status: DC
  Administered 2013-06-22: 21 mg via TRANSDERMAL
  Filled 2013-06-22: qty 1

## 2013-06-22 MED ORDER — LACTULOSE 10 GM/15ML PO SOLN
20.0000 g | Freq: Every day | ORAL | Status: DC
  Administered 2013-06-23 – 2013-06-26 (×4): 20 g via ORAL
  Filled 2013-06-22 (×4): qty 30

## 2013-06-22 MED ORDER — LORAZEPAM 2 MG/ML IJ SOLN: 0.0000 mg | Freq: Two times a day (BID) | INTRAMUSCULAR | Status: DC

## 2013-06-22 MED ORDER — LORAZEPAM 2 MG/ML IJ SOLN
4.0000 mg | Freq: Once | INTRAMUSCULAR | Status: AC
  Administered 2013-06-22: 4 mg via INTRAVENOUS
  Filled 2013-06-22: qty 2

## 2013-06-22 MED ORDER — DM-GUAIFENESIN ER 30-600 MG PO TB12
1.0000 | ORAL_TABLET | Freq: Two times a day (BID) | ORAL | Status: DC
  Administered 2013-06-23 – 2013-06-26 (×7): 1 via ORAL
  Filled 2013-06-22 (×10): qty 1

## 2013-06-22 MED ORDER — GABAPENTIN 300 MG PO CAPS
600.0000 mg | ORAL_CAPSULE | Freq: Every day | ORAL | Status: DC
  Administered 2013-06-23 – 2013-06-25 (×3): 600 mg via ORAL
  Filled 2013-06-22 (×5): qty 2

## 2013-06-22 MED ORDER — GABAPENTIN 300 MG PO CAPS: 300.0000 mg | ORAL_CAPSULE | Freq: Three times a day (TID) | ORAL | Status: DC

## 2013-06-22 MED ORDER — VITAMIN B-1 100 MG PO TABS
100.0000 mg | ORAL_TABLET | Freq: Every day | ORAL | Status: DC
  Administered 2013-06-24 – 2013-06-25 (×2): 100 mg via ORAL
  Filled 2013-06-22 (×5): qty 1

## 2013-06-22 MED ORDER — THIAMINE HCL 100 MG/ML IJ SOLN
100.0000 mg | Freq: Every day | INTRAMUSCULAR | Status: DC
  Administered 2013-06-22 – 2013-06-26 (×3): 100 mg via INTRAVENOUS
  Filled 2013-06-22 (×5): qty 1

## 2013-06-22 MED ORDER — GABAPENTIN 300 MG PO CAPS
300.0000 mg | ORAL_CAPSULE | Freq: Two times a day (BID) | ORAL | Status: DC
  Administered 2013-06-23 – 2013-06-26 (×8): 300 mg via ORAL
  Filled 2013-06-22 (×10): qty 1

## 2013-06-22 MED ORDER — LORAZEPAM 2 MG/ML IJ SOLN
1.0000 mg | Freq: Four times a day (QID) | INTRAMUSCULAR | Status: DC
  Administered 2013-06-22 – 2013-06-26 (×12): 1 mg via INTRAVENOUS
  Filled 2013-06-22 (×11): qty 1

## 2013-06-22 MED ORDER — LORAZEPAM 2 MG/ML IJ SOLN: 1.0000 mg | Freq: Four times a day (QID) | INTRAMUSCULAR | Status: DC | PRN

## 2013-06-22 MED ORDER — VANCOMYCIN HCL IN DEXTROSE 1-5 GM/200ML-% IV SOLN
1000.0000 mg | Freq: Three times a day (TID) | INTRAVENOUS | Status: DC
  Administered 2013-06-22 – 2013-06-23 (×5): 1000 mg via INTRAVENOUS
  Filled 2013-06-22 (×6): qty 200

## 2013-06-22 MED ORDER — SODIUM CHLORIDE 0.9 % IV SOLN
INTRAVENOUS | Status: DC
  Administered 2013-06-22: 125 mL via INTRAVENOUS
  Administered 2013-06-23 – 2013-06-26 (×7): via INTRAVENOUS

## 2013-06-22 MED ORDER — FOLIC ACID 1 MG PO TABS
1.0000 mg | ORAL_TABLET | Freq: Every day | ORAL | Status: DC
  Administered 2013-06-23 – 2013-06-26 (×4): 1 mg via ORAL
  Filled 2013-06-22 (×5): qty 1

## 2013-06-22 NOTE — Progress Notes (Signed)
eLink Physician-Brief Progress Note Patient Name: Ronnie OrtWally Dobos DOB: September 03, 1965 MRN: [id number]  Date of Service  06/22/2013   HPI/Events of Note   Agitation inspite of precedxe @ 0.7  eICU Interventions  Can increase to 1.2 Use ativan for breakthrough   Intervention Category Major Interventions: Delirium, psychosis, severe agitation - evaluation and management  ALVA,RAKESH V. 06/22/2013, 4:53 AM
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
## 2013-06-22 NOTE — Progress Notes (Signed)
CARE MANAGEMENT NOTE 06/22/2013  Patient:  Ponciano OrtBURRELL,Quindell   Account Number:  [id number]  Date Initiated:  06/22/2013  Documentation initiated by:  Huel Centola  Subjective/Objective Assessment:   acute etoh withdrawal with witnessed seizure activity.     Action/Plan:   patient has recently been in the ed for same but refused treatment and now is requesting detox.   Anticipated DC Date:  06/25/2013   Anticipated DC Plan:  [facility]AT[facility]IC [facility]OSPITAL  In-house referral  Clinical Social Worker  Artistinancial Counselor      DC Planning Services  NA      [facility]  NA   Choice offered to / List presented to:  NA   DME arranged  NA      DME agency  NA     HH arranged  NA      HH agency  NA   Status of service:  In process, will continue to follow Medicare Important Message given?  NA - LOS <3 / Initial given by admissions (If response is "NO", the following Medicare IM given date fields will be blank) Date Medicare IM given:   Date Additional Medicare IM given:    Discharge Disposition:    Per UR Regulation:  Reviewed for med. necessity/level of care/duration of stay  If discussed at Long Length of Stay Meetings, dates discussed:    Comments:  [id number]/Mandeep Kiser Stark JockDavis, RN, BSN, [place]M [phone] Chart Reviewed for discharge and hospital needs. Discharge needs at time of review:  None present will follow for needs. Review of patient progress due on [id number].

## 2013-06-22 NOTE — Progress Notes (Signed)
eLink Physician-Brief Progress Note Patient Name: Ronnie OrtWally Mays DOB: March 19, 1966 MRN: [id number]  Date of Service  06/22/2013   HPI/Events of Note     eICU Interventions  Changed to SDU alcohol withdrawal protocol - to allow for higher dose ativan   Intervention Category Major Interventions: Delirium, psychosis, severe agitation - evaluation and management  ALVA,RAKESH V. 06/22/2013, 3:22 AM

## 2013-06-23 ENCOUNTER — Inpatient Hospital Stay (HOSPITAL_COMMUNITY): Payer: Self-pay

## 2013-06-23 DIAGNOSIS — F1020 Alcohol dependence, uncomplicated: Secondary | ICD-10-CM

## 2013-06-23 DIAGNOSIS — I10 Essential (primary) hypertension: Secondary | ICD-10-CM

## 2013-06-23 DIAGNOSIS — J189 Pneumonia, unspecified organism: Secondary | ICD-10-CM

## 2013-06-23 LAB — BASIC METABOLIC PANEL
BUN: 9 mg/dL (ref 6–23)
CALCIUM: 7.8 mg/dL — AB (ref 8.4–10.5)
CO2: 20 mEq/L (ref 19–32)
CREATININE: 0.6 mg/dL (ref 0.50–1.35)
Chloride: 103 mEq/L (ref 96–112)
GFR calc Af Amer: >90 mL/min (ref >90)
GFR calc non Af Amer: >90 mL/min (ref >90)
GLUCOSE: 96 mg/dL (ref 70–99)
Potassium: 3.9 mEq/L (ref 3.7–5.3)
Sodium: 135 mEq/L — ABNORMAL LOW (ref 137–147)

## 2013-06-23 LAB — CBC
HEMATOCRIT: 39.9 % (ref 39.0–52.0)
HEMOGLOBIN: 13.7 g/dL (ref 13.0–17.0)
MCH: 35.6 pg — AB (ref 26.0–34.0)
MCHC: 34.3 g/dL (ref 30.0–36.0)
MCV: 103.6 fL — AB (ref 78.0–100.0)
Platelets: 63 10*3/uL — ABNORMAL LOW (ref 150–400)
RBC: 3.85 MIL/uL — ABNORMAL LOW (ref 4.22–5.81)
RDW: 14.2 % (ref 11.5–15.5)
WBC: 2.8 10*3/uL — ABNORMAL LOW (ref 4.0–10.5)

## 2013-06-23 LAB — MAGNESIUM: MAGNESIUM: 1.7 mg/dL (ref 1.5–2.5)

## 2013-06-23 LAB — HEPATITIS PANEL, ACUTE
HCV Ab: REACTIVE — AB
Hep A IgM: NONREACTIVE
Hep B C IgM: NONREACTIVE
Hepatitis B Surface Ag: NEGATIVE

## 2013-06-23 LAB — PROTIME-INR
INR: 1.27 (ref 0.00–1.49)
PROTHROMBIN TIME: 15.6 s — AB (ref 11.6–15.2)

## 2013-06-23 LAB — APTT: aPTT: 33 seconds (ref 24–37)

## 2013-06-23 LAB — PHOSPHORUS: PHOSPHORUS: 2.4 mg/dL (ref 2.3–4.6)

## 2013-06-23 LAB — VANCOMYCIN, TROUGH: VANCOMYCIN TR: 10.8 ug/mL (ref 10.0–20.0)

## 2013-06-23 MED ORDER — PANTOPRAZOLE SODIUM 40 MG PO TBEC
40.0000 mg | DELAYED_RELEASE_TABLET | Freq: Two times a day (BID) | ORAL | Status: DC
  Administered 2013-06-23 – 2013-06-26 (×6): 40 mg via ORAL
  Filled 2013-06-23 (×8): qty 1

## 2013-06-23 MED ORDER — DEXMEDETOMIDINE HCL IN NACL 200 MCG/50ML IV SOLN
0.2000 ug/kg/h | INTRAVENOUS | Status: AC
  Administered 2013-06-23: 0.2 ug/kg/h via INTRAVENOUS

## 2013-06-23 MED ORDER — METOPROLOL TARTRATE 12.5 MG HALF TABLET
12.5000 mg | ORAL_TABLET | Freq: Two times a day (BID) | ORAL | Status: DC
  Administered 2013-06-23 – 2013-06-26 (×7): 12.5 mg via ORAL
  Filled 2013-06-23 (×8): qty 1

## 2013-06-23 MED ORDER — VANCOMYCIN HCL 10 G IV SOLR
1250.0000 mg | Freq: Three times a day (TID) | INTRAVENOUS | Status: DC
  Administered 2013-06-23 – 2013-06-25 (×6): 1250 mg via INTRAVENOUS
  Filled 2013-06-23 (×8): qty 1250

## 2013-06-23 MED ORDER — SERTRALINE HCL 50 MG PO TABS
50.0000 mg | ORAL_TABLET | Freq: Every day | ORAL | Status: DC
  Administered 2013-06-23 – 2013-06-26 (×4): 50 mg via ORAL
  Filled 2013-06-23 (×4): qty 1

## 2013-06-23 MED ORDER — LISINOPRIL 5 MG PO TABS
5.0000 mg | ORAL_TABLET | Freq: Every day | ORAL | Status: DC
  Administered 2013-06-23 – 2013-06-26 (×4): 5 mg via ORAL
  Filled 2013-06-23 (×4): qty 1

## 2013-06-23 NOTE — Progress Notes (Signed)
Name: Ronnie OrtWally Mangels MRN: [id number] DOB: 07-05-1965    ADMISSION DATE:  06/21/2013 CONSULTATION DATE:  06/22/13  REFERRING MD : [facility]ad PRIMARY SERVICE: [facility]  CHIEF COMPLAINT:  delerium  BRIEF PATIENT DESCRIPTION: 48 yo WM with life long ETOH abuse (10 x 40 oz beer daily) DT's, Szs,cirrhosis who [facility] with etoh level >300.  SIGNIFICANT EVENTS / STUDIES:  2/2 on precedex for agitation  LINES / TUBES: PIV  CULTURES: 2/2 bc x 2>>   ANTIBIOTICS: 2/2 vanc>> 2/2 zoysn>>  HISTORY OF PRESENT ILLNESS:   48 yo (looks [age]) WM with life long ETOH abuse(10 x 40 oz beer daily) DT's, Szs,cirrhosis who [facility] with etoh level >300. [facility] but had worsening encephalopathy and [facility] and found to have RML infiltrate best seen on lateral film. He required precedex and PCCM asked to consult.  SUBJECTIVE:  Calmer on low dose precedex  VITAL SIGNS: Temp:  [96.2 F (35.7 C)-98.4 F (36.9 C)] 98.4 F (36.9 C) (02/03 0800) Pulse Rate:  [56-69] 67 (02/03 0700) Resp:  [14-18] 18 (02/03 0700) BP: (112-174)/(67-108) 159/95 mmHg (02/03 0700) SpO2:  [90 %-98 %] 95 % (02/03 0700) Weight:  [175 lb 4.3 oz (79.5 kg)] 175 lb 4.3 oz (79.5 kg) (02/03 0400) HEMODYNAMICS:   VENTILATOR SETTINGS:   INTAKE / OUTPUT: Intake/Output     02/02 0701 - 02/03 0700 02/03 0701 - 02/04 0700   I.V. (mL/kg) 3003.5 (37.8)    IV Piggyback 662.5    Total Intake(mL/kg) 3666 (46.1)    Urine (mL/kg/hr) 2720 (1.4)    Total Output 2720     Net +946          Urine Occurrence 1 x      PHYSICAL EXAMINATION: General:  Calmer , disheveled wm who appears older than 47 Neuro:  Sedated, non verbal HEENT:  No LAN/JVD Cardiovascular:  HSR RRR Lungs:  Decreased bs.  Abdomen: Soft + bs Musculoskeletal:  intact Skin:  warm  LABS:  CBC  Recent Labs Lab 06/21/13 2120 06/22/13 0215 06/23/13 0353  WBC 3.1* 3.0* 2.8*  HGB 13.0 12.7* 13.7  HCT  38.8* 38.4* 39.9  PLT 59* 50* 63*   Coag's  Recent Labs Lab 06/21/13 1949 06/22/13 0215 06/23/13 0353  APTT  --   --  33  INR 1.25 1.39 1.27   BMET  Recent Labs Lab 06/21/13 2120 06/22/13 0215 06/23/13 0353  NA 136* 136* 135*  K 4.2 4.1 3.9  CL 101 104 103  CO2 24 22 20   BUN 12 10 9   CREATININE 0.66 0.57 0.60  GLUCOSE 103* 83 96   Electrolytes  Recent Labs Lab 06/21/13 2120 06/22/13 0215 06/23/13 0353  CALCIUM 9.1 8.1* 7.8*  MG  --   --  1.7  PHOS  --   --  2.4   Sepsis Markers No results found for this basename: LATICACIDVEN, PROCALCITON, O2SATVEN,  in the last 168 hours ABG No results found for this basename: PHART, PCO2ART, PO2ART,  in the last 168 hours Liver Enzymes  Recent Labs Lab 06/21/13 1949 06/21/13 2120 06/22/13 0215  AST 158* 142* 134*  ALT 102* 95* 89*  ALKPHOS 125* 115 106  BILITOT 2.7* 2.6* 2.4*  ALBUMIN 3.7 3.4* 3.1*   Cardiac Enzymes No results found for this basename: TROPONINI, PROBNP,  in the last 168 hours Glucose  Recent Labs Lab 06/21/13 2235 06/22/13 0154  GLUCAP 98 84    Imaging Dg Chest 2 View  06/21/2013   CLINICAL DATA:  Alcohol intoxication  EXAM: CHEST  2 VIEW  COMPARISON:  None.  FINDINGS: Mild cardiac enlargement. Vascular pattern normal. There is consolidation in the right middle lobe. Left lung is clear. No pleural effusions.  IMPRESSION: Right middle lobe consolidation.  This could represent pneumonitis.   Electronically Signed   By: Esperanza Heir M.D.   On: 06/21/2013 20:56   Dg Chest Port 1 View  06/23/2013   CLINICAL DATA:  Pneumonia  EXAM: PORTABLE CHEST - 1 VIEW  COMPARISON:  06/21/2013  FINDINGS: The cardiac shadow is stable. The lungs are well aerated bilaterally. Mild persistent changes are noted in the right middle lobe. No new focal infiltrate is seen. No acute bony abnormality is noted.  IMPRESSION: No significant change from the prior exam   Electronically Signed   By: Alcide Clever M.D.   On:  06/23/2013 07:39    ASSESSMENT / PLAN:  PULMONARY A:Suspected OSA RML PNA; consider post-obstructive lesion is not clearing in appropriate interval of 3-4 weeks P:   O2 as needed, he would not tolerate cpap at this time Will need PSG at some point in the future Abx as below Sputum culture if he is able to produce  CARDIOVASCULAR A: HTN, not yet on home meds P:  Timing for restart BP regimen will depend on MS  RENAL A:  No acute issue P:    GASTROINTESTINAL A:  GI Protection      Cirrhosis P:   PPI   HEMATOLOGIC A:  No acute issue P:   INFECTIOUS A:  RML consolidation / PNA P:   Currently vanco + zosyn, would stop vanco 2/4, change to Po when he can tolerate  ENDOCRINE A:  No acute   P:     NEUROLOGIC A:  Sedated or agitated in setting of ETOH wd       Depression       Polysubstance abuse       Ammonia level 40->43 P:   Consider CT head when able Wean precedex, continue scheduled ativan  TODAY'S SUMMARY:  Wean precedex as tolerated, when off 24 hours then move out of unit.  Brett Canales Minor ACNP [facility] PCCM Pager [phone] till 3 pm If no answer page [phone] 06/23/2013, 9:20 AM  Levy Pupa, MD, PhD 06/23/2013, 10:40 AM [facility] [phone] or if no answer [phone]

## 2013-06-23 NOTE — Progress Notes (Signed)
[id number]/Rhonda Earlene Plateravis, RN, BSN, CCM, [phone] Chart reviewed for update of needs and condition. md plan for [id number]-wean off precedex and move out of unit./ poss ct of head needed.
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
## 2013-06-23 NOTE — Progress Notes (Signed)
ANTIBIOTIC CONSULT NOTE - FOLLOW UP  Pharmacy Consult for Vancomycin Indication: HCAP vs. Aspiration PNA  Brief Pharmacy Note:  Assessment: Please see Pharmacist note from earlier today for full details [age] yr [facility]. Pt with hepatic encephalopathy and with a right middle lobe consolidation per chest xray.   Day #2 Vancomycin 1g IV q8h and Zosyn 3.375g IV q8h  Vancomycin trough drawn today prior to 6th dose below goal range at 10.8 on 1g IV q8h  Goal of Therapy:  Vancomycin trough level 15-20 mcg/ml  Plan:  - increase vancomycin to 1250mg  IV q8h - follow-up vancomycin trough at new steady state - follow-up clinical course, culture results, renal function - follow-up antibiotic de-escalation and length of therapy  Thank you for the consult.  Tomi BambergerJesse Sheng Pritz, PharmD, BCPS Pager: [phone] Pharmacy: [phone] 06/23/2013 7:12 PM

## 2013-06-23 NOTE — Progress Notes (Signed)
[facility] PROGRESS NOTE  Ronnie Mays KGM:[id number] DOB: [birth year] DOA: 06/21/2013 PCP: No primary provider on file.  Assessment/Plan:  HCAP vs aspiration pneumonia -CXR demonstrated RML consolidation -Continue vancomycin and Zosyn -Blood culture; NTD -sputum cultures pending -Flutter valve -DuoNeb -Mucinex  Alcohol withdrawal/delirium tremens. -Patient presenting in DTs, transfer from Behavioral health. -presented on 06/16/2013 intoxicated, developing signs and symptoms of alcohol withdrawal over the last 24 hours which has progressively worsened. Admitted to step down unit, -Continue alcohol withdrawal protocol using CIWA scale.  -Continue thiamine, folate and IV fluid resuscitation.  -Psychiatry consult for competency  Hyperammonemia.  -Admission ammonia level= 82umol/L; 2/3 ammonia level=43 umol/L. -Continue lactulose therapy.  -Continue lactulose therapy to 20 g daily.   Elevated transaminases. - Likely secondary to alcohol abuse. Trending down  -Monitor closely   HTN  - Patient now awake and able to by mouth medication DC  metoprolol 5 mg IV  TID -Metoprolol 12.5 bid -Lisinopril 5 mg daily     Substance abuse -Alcohol and benzodiazepine   Hemoptysis -Possibly secondary to esophageal/gastric ulcer secondary to his alcohol substance abuse vs esophageal varices (does not recall having EGD) -Will obtain TB QuantiFERON -Increase Protonix to 40 mg BID -Occult blood card -Monitor closely hemoglobin level  Depression -Restart Zoloft 50 mg daily    Code Status: Full Family Communication: None available Disposition Plan: Resolution of alcohol intoxication   Consultants: Psychiatry consult pending   Procedures: 2/2 MRSA by PCR negative  06/21/2013 CXR Right middle lobe consolidation. This could represent pneumonitis.    Antibiotics: Zosyn 2/2 Vancomycin 2/2    HPI/Subjective: Ronnie Mays is a 48 y.o. WM PMHx  alcohol abuse, liver  cirrhosis, depression , history of delirium tremens, who presented to the emergency room at [facility] on 06/16/2013 with alcohol intoxication, having a blood alcohol level of 325. He expressed wishes to undergo detox. Patient was admitted to behavioral health. on 06/21/2013 medicine was consulted for increased confusion, hallucinations, tremors. He is Ativan dose was increased as he was started on lactulose for hyperammonemia. despite these interventions, patient continued to have worsening of his encephalopathy. He was noted by staff at [facility] to have gait instability, incontinence, with increased confusion. Also noted has been increased coughing over the past several days. a chest x-ray performed on admission showed the presence of right middle lobe consolidation. During my encounter patient appears tremors, cannot tell me where we are or correctly identified the month or year. 2/2 patient sleeping peacefully, restrained. Medical staff relates to me that overnight patient was belligerent, resulting medical staff. Security was called patient to be physically and chemically restrained. Chemical restraint (Precedex) was DC'd at 0640 this a.m. 2/3 patient reporting hemoptysis x1 month with some shortness of breath.      Objective: Filed Vitals:   06/23/13 0600 06/23/13 0624 06/23/13 0700 06/23/13 0800  BP: 163/99 150/95 159/95   Pulse: 67  67   Temp:    98.4 F (36.9 C)  TempSrc:    Oral  Resp: 16  18   Height:      Weight:      SpO2: 92%  95%     Intake/Output Summary (Last 24 hours) at 06/23/13 1038 Last data filed at 06/23/13 0700  Gross per 24 hour  Intake 3090.95 ml  Output   2720 ml  Net 370.95 ml   Filed Weights   06/22/13 0134 06/22/13 0135 06/23/13 0400  Weight: 79 kg (174 lb 2.6 oz) 74.8 kg (164 lb  14.5 oz) 79.5 kg (175 lb 4.3 oz)    Exam:   General:  A./O. X4,NAD, some tremors generalized  Cardiovascular: Regular rhythm and rate, negative murmurs rubs  gallops, DP/PT pulse +1 bilateral  Respiratory: Clear to auscultation bilateral  Abdomen: Soft, nontender, nondistended, plus bowel sounds  Musculoskeletal: Negative pedal edema, negative ecchymosis negative cyanosis   Data Reviewed: Basic Metabolic Panel:  Recent Labs Lab 06/21/13 0411 06/21/13 1949 06/21/13 2120 06/22/13 0215 06/23/13 0353  NA 131* 133* 136* 136* 135*  K 4.6 4.5 4.2 4.1 3.9  CL 97 99 101 104 103  CO2 24 21 24 22 20   GLUCOSE 87 92 103* 83 96  BUN 15 13 12 10 9   CREATININE 0.67 0.66 0.66 0.57 0.60  CALCIUM 9.3 9.4 9.1 8.1* 7.8*  MG  --   --   --   --  1.7  PHOS  --   --   --   --  2.4   Liver Function Tests:  Recent Labs Lab 06/16/13 1740 06/21/13 0411 06/21/13 1949 06/21/13 2120 06/22/13 0215  AST 244* 151* 158* 142* 134*  ALT 115* 99* 102* 95* 89*  ALKPHOS 104 140* 125* 115 106  BILITOT 2.0* 2.5* 2.7* 2.6* 2.4*  PROT 9.0* 8.2 8.7* 8.2 7.5  ALBUMIN 3.8 3.4* 3.7 3.4* 3.1*   No results found for this basename: LIPASE, AMYLASE,  in the last 168 hours  Recent Labs Lab 06/21/13 0411 06/21/13 1949 06/21/13 2033 06/22/13 1045  AMMONIA 82* RESULTS UNAVAILABLE DUE TO INTERFERING SUBSTANCE 40 43   CBC:  Recent Labs Lab 06/16/13 1740 06/21/13 2120 06/22/13 0215 06/23/13 0353  WBC 3.1* 3.1* 3.0* 2.8*  NEUTROABS 1.3* 1.5* 1.4*  --   HGB 14.2 13.0 12.7* 13.7  HCT 41.2 38.8* 38.4* 39.9  MCV 101.7* 104.3* 105.2* 103.6*  PLT 47* 59* 50* 63*   Cardiac Enzymes: No results found for this basename: CKTOTAL, CKMB, CKMBINDEX, TROPONINI,  in the last 168 hours BNP (last 3 results) No results found for this basename: PROBNP,  in the last 8760 hours CBG:  Recent Labs Lab 06/21/13 2235 06/22/13 0154  GLUCAP 98 84    Recent Results (from the past 240 hour(s))  MRSA PCR SCREENING     Status: None   Collection Time    06/22/13  1:35 AM      Result Value Range Status   MRSA by PCR NEGATIVE  NEGATIVE Final   Comment:            The GeneXpert  MRSA Assay (FDA     approved for NASAL specimens     only), is one component of a     comprehensive MRSA colonization     surveillance program. It is not     intended to diagnose MRSA     infection nor to guide or     monitor treatment for     MRSA infections.  CULTURE, BLOOD (ROUTINE X 2)     Status: None   Collection Time    06/22/13  2:15 AM      Result Value Range Status   Specimen Description BLOOD LEFT HAND   Final   Special Requests BOTTLES DRAWN AEROBIC AND ANAEROBIC [facility]   Final   Culture  Setup Time     Final   Value: 06/22/2013 08:35     Performed at [facility]   Culture     Final   Value:  BLOOD CULTURE RECEIVED NO GROWTH TO DATE CULTURE WILL BE HELD FOR 5 DAYS BEFORE ISSUING A FINAL NEGATIVE REPORT     [facility]formed at [facility]   Report Status PENDING   Incomplete  CULTURE, BLOOD (ROUTINE X 2)     Status: None   Collection Time    06/22/13  2:20 AM      Result Value Range Status   Specimen Description BLOOD LEFT ARM   Final   Special Requests BOTTLES DRAWN AEROBIC AND ANAEROBIC [facility]   Final   Culture  Setup Time     Final   Value: 06/22/2013 08:35     Performed at [facility]   Culture     Final   Value:        BLOOD CULTURE RECEIVED NO GROWTH TO DATE CULTURE WILL BE HELD FOR 5 DAYS BEFORE ISSUING A FINAL NEGATIVE REPORT     Performed at [facility]   Report Status PENDING   Incomplete     Studies: Dg Chest 2 View  06/21/2013   CLINICAL DATA:  Alcohol intoxication  EXAM: CHEST  2 VIEW  COMPARISON:  None.  FINDINGS: Mild cardiac enlargement. Vascular pattern normal. There is consolidation in the right middle lobe. Left lung is clear. No pleural effusions.  IMPRESSION: Right middle lobe consolidation.  This could represent pneumonitis.   Electronically Signed   By: Esperanza Heiraymond  Rubner M.D.   On: 06/21/2013 20:56   Dg Chest Port 1 View  06/23/2013   CLINICAL DATA:  Pneumonia  EXAM: PORTABLE CHEST - 1 VIEW  COMPARISON:  06/21/2013   FINDINGS: The cardiac shadow is stable. The lungs are well aerated bilaterally. Mild persistent changes are noted in the right middle lobe. No new focal infiltrate is seen. No acute bony abnormality is noted.  IMPRESSION: No significant change from the prior exam   Electronically Signed   By: Alcide CleverMark  Lukens M.D.   On: 06/23/2013 07:39    Scheduled Meds: . dextromethorphan-guaiFENesin  1 tablet Oral BID  . folic acid  1 mg Oral Daily  . gabapentin  300 mg Oral BID  . gabapentin  600 mg Oral QHS  . influenza vac split quadrivalent PF  0.5 mL Intramuscular Tomorrow-1000  . lactulose  20 g Oral Daily  . LORazepam  1 mg Intravenous Q6H  . metoprolol  5 mg Intravenous Q8H  . multivitamin with minerals  1 tablet Oral Daily  . pantoprazole  40 mg Oral Daily  . piperacillin-tazobactam (ZOSYN)  IV  3.375 g Intravenous Q8H  . pneumococcal 23 valent vaccine  0.5 mL Intramuscular Tomorrow-1000  . thiamine  100 mg Oral Daily   Or  . thiamine  100 mg Intravenous Daily  . vancomycin  1,000 mg Intravenous Q8H   Continuous Infusions: . sodium chloride 125 mL/hr at 06/23/13 0101  . dexmedetomidine Stopped (06/23/13 0801)    Active Problems:   Alcohol withdrawal   Depressive disorder   Alcohol dependence   Acute encephalopathy   Substance abuse   HTN (hypertension)   Elevated transaminase level    Time spent: 40 minute    Pretty Weltman, J  [facility] Pager [phone]. If 7PM-7AM, please contact night-coverage at [email or web address], password [email or web address] 06/23/2013, 10:38 AM  LOS: 2 days

## 2013-06-23 NOTE — Progress Notes (Signed)
ANTIBIOTIC CONSULT NOTE - FOLLOW UP  Pharmacy Consult for Vancomycin Indication: HCAP vs. Aspiration PNA  Allergies  Allergen Reactions  . Trazodone And Nefazodone     Priapism.     Patient Measurements: Height: 5\' 10"  (177.8 cm) Weight: 175 lb 4.3 oz (79.5 kg) IBW/kg (Calculated) : 73  Vital Signs: Temp: 98.1 F (36.7 C) (02/03 1200) Temp src: Axillary (02/03 1200) BP: 136/88 mmHg (02/03 1300) Pulse Rate: 66 (02/03 1300) Intake/Output from previous day: 02/02 0701 - 02/03 0700 In: 3666 [I.V.:3003.5; IV Piggyback:662.5] Out: 2720 [Urine:2720] Intake/Output from this shift: Total I/O In: 1000 [I.V.:750; IV Piggyback:250] Out: -   Labs:  Recent Labs  06/21/13 2120 06/22/13 0215 06/23/13 0353  WBC 3.1* 3.0* 2.8*  HGB 13.0 12.7* 13.7  PLT 59* 50* 63*  CREATININE 0.66 0.57 0.60   Estimated Creatinine Clearance: 119.1 ml/min (by C-G formula based on Cr of 0.6). No results found for this basename: VANCOTROUGH, Leodis BinetVANCOPEAK, VANCORANDOM, GENTTROUGH, GENTPEAK, GENTRANDOM, TOBRATROUGH, TOBRAPEAK, TOBRARND, AMIKACINPEAK, AMIKACINTROU, AMIKACIN,  in the last 72 hours   Microbiology: Recent Results (from the past 720 hour(s))  MRSA PCR SCREENING     Status: None   Collection Time    06/22/13  1:35 AM      Result Value Range Status   MRSA by PCR NEGATIVE  NEGATIVE Final   Comment:            The GeneXpert MRSA Assay (FDA     approved for NASAL specimens     only), is one component of a     comprehensive MRSA colonization     surveillance program. It is not     intended to diagnose MRSA     infection nor to guide or     monitor treatment for     MRSA infections.  CULTURE, BLOOD (ROUTINE X 2)     Status: None   Collection Time    06/22/13  2:15 AM      Result Value Range Status   Specimen Description BLOOD LEFT HAND   Final   Special Requests BOTTLES DRAWN AEROBIC AND ANAEROBIC [facility]   Final   Culture  Setup Time     Final   Value: 06/22/2013 08:35     Performed at  [facility]   Culture     Final   Value:        BLOOD CULTURE RECEIVED NO GROWTH TO DATE CULTURE WILL BE HELD FOR 5 DAYS BEFORE ISSUING A FINAL NEGATIVE REPORT     Performed at [facility]   Report Status PENDING   Incomplete  CULTURE, BLOOD (ROUTINE X 2)     Status: None   Collection Time    06/22/13  2:20 AM      Result Value Range Status   Specimen Description BLOOD LEFT ARM   Final   Special Requests BOTTLES DRAWN AEROBIC AND ANAEROBIC 6[facility]   Final   Culture  Setup Time     Final   Value: 06/22/2013 08:35     Performed at [facility]   Culture     Final   Value:        BLOOD CULTURE RECEIVED NO GROWTH TO DATE CULTURE WILL BE HELD FOR 5 DAYS BEFORE ISSUING A FINAL NEGATIVE REPORT     Performed at [facility]   Report Status PENDING   Incomplete    Anti-infectives: 2/1 >>Rocephin x 1 >>2/1 2/1 >>Azith x 1 >> 2/1 2/2 >>  Zosyn >> 2/2 >> Vanc >>  Assessment: 48 yr male brought over from [facility] where being treated for alcohol withdrawal. Pt with hepatic encephalopathy and with a right middle lobe consolidation per chest xray.   Day #2 Vancomycin 1g IV q8h and Zosyn 3.375g IV q8h  Afebrile, Tmin 96.9  WBC low 2.8  SCr wnl, CrCl > 100 ml/min  Cultures negative to date  Goal of Therapy:  Vancomycin trough level 15-20 mcg/ml  Plan:   Check Vancomycin trough tonight before 6th dose  Continue Zosyn 3.375gm IV q8h (4hr extended infusions) per MD Follow up renal function & cultures, de-escalation of therapy  Loralee Pacas, PharmD, BCPS Pager: [phone] 06/23/2013,1:42 PM

## 2013-06-24 LAB — CBC WITH DIFFERENTIAL/PLATELET
Basophils Absolute: 0 10*3/uL (ref 0.0–0.1)
Basophils Relative: 1 % (ref 0–1)
Eosinophils Absolute: 0.1 10*3/uL (ref 0.0–0.7)
Eosinophils Relative: 1 % (ref 0–5)
HCT: 37.9 % — ABNORMAL LOW (ref 39.0–52.0)
Hemoglobin: 12.9 g/dL — ABNORMAL LOW (ref 13.0–17.0)
LYMPHS ABS: 0.6 10*3/uL — AB (ref 0.7–4.0)
Lymphocytes Relative: 15 % (ref 12–46)
MCH: 34.7 pg — ABNORMAL HIGH (ref 26.0–34.0)
MCHC: 34 g/dL (ref 30.0–36.0)
MCV: 101.9 fL — AB (ref 78.0–100.0)
Monocytes Absolute: 1.1 10*3/uL — ABNORMAL HIGH (ref 0.1–1.0)
Monocytes Relative: 25 % — ABNORMAL HIGH (ref 3–12)
NEUTROS PCT: 58 % (ref 43–77)
Neutro Abs: 2.5 10*3/uL (ref 1.7–7.7)
PLATELETS: 67 10*3/uL — AB (ref 150–400)
RBC: 3.72 MIL/uL — AB (ref 4.22–5.81)
RDW: 13.9 % (ref 11.5–15.5)
WBC: 4.4 10*3/uL (ref 4.0–10.5)

## 2013-06-24 LAB — COMPREHENSIVE METABOLIC PANEL
ALBUMIN: 2.8 g/dL — AB (ref 3.5–5.2)
ALT: 115 U/L — ABNORMAL HIGH (ref 0–53)
AST: 168 U/L — ABNORMAL HIGH (ref 0–37)
Alkaline Phosphatase: 110 U/L (ref 39–117)
BILIRUBIN TOTAL: 2.2 mg/dL — AB (ref 0.3–1.2)
BUN: 8 mg/dL (ref 6–23)
CALCIUM: 7.6 mg/dL — AB (ref 8.4–10.5)
CHLORIDE: 100 meq/L (ref 96–112)
CO2: 20 mEq/L (ref 19–32)
Creatinine, Ser: 0.56 mg/dL (ref 0.50–1.35)
GFR calc Af Amer: >90 mL/min (ref >90)
GFR calc non Af Amer: >90 mL/min (ref >90)
Glucose, Bld: 120 mg/dL — ABNORMAL HIGH (ref 70–99)
Potassium: 3.5 mEq/L — ABNORMAL LOW (ref 3.7–5.3)
Sodium: 132 mEq/L — ABNORMAL LOW (ref 137–147)
Total Protein: 7 g/dL (ref 6.0–8.3)

## 2013-06-24 LAB — QUANTIFERON TB GOLD ASSAY (BLOOD)
INTERFERON GAMMA RELEASE ASSAY: NEGATIVE
MITOGEN VALUE: 2.32 [IU]/mL
QUANTIFERON NIL VALUE: 0.06 [IU]/mL
TB AG VALUE: 0.06 [IU]/mL
TB Antigen Minus Nil Value: 0 IU/mL

## 2013-06-24 MED ORDER — LORAZEPAM 1 MG PO TABS: 1.0000 mg | ORAL_TABLET | ORAL | Status: DC | PRN

## 2013-06-24 MED ORDER — NICOTINE 21 MG/24HR TD PT24
21.0000 mg | MEDICATED_PATCH | Freq: Every day | TRANSDERMAL | Status: DC
  Administered 2013-06-24 – 2013-06-25 (×2): 21 mg via TRANSDERMAL
  Filled 2013-06-24 (×3): qty 1

## 2013-06-24 NOTE — Progress Notes (Signed)
Name: Ronnie OrtWally Boteler MRN: [id number] DOB: 07-26-1965    ADMISSION DATE:  06/21/2013 CONSULTATION DATE:  06/22/13  REFERRING MD : [facility] PRIMARY SERVICE: [facility]  CHIEF COMPLAINT:  delerium  BRIEF PATIENT DESCRIPTION: 48 yo WM with life long ETOH abuse (10 x 40 oz beer daily) DT's, Szs,cirrhosis who [facility] with etoh level >300.  SIGNIFICANT EVENTS / STUDIES:  2/2 on precedex for agitation 2/4 off precedex LINES / TUBES: PIV  CULTURES: 2/2 bc x 2>>   ANTIBIOTICS: 2/2 vanc>> 2/2 zoysn>>  HISTORY OF PRESENT ILLNESS:   48 yo (looks 6666) WM with life long ETOH abuse(10 x 40 oz beer daily) DT's, Szs,cirrhosis who [facility] with etoh level >300. [facility] for [facility] but had worsening encephalopathy and [facility] and found to have RML infiltrate best seen on lateral film. He required precedex and PCCM asked to consult.  SUBJECTIVE:  Calmer off precedex  VITAL SIGNS: Temp:  [97.8 F (36.6 C)-100.7 F (38.2 C)] 98.1 F (36.7 C) (02/04 0800) Pulse Rate:  [63-79] 72 (02/04 0800) Resp:  [14-22] 17 (02/04 0800) BP: (116-173)/(64-114) 134/81 mmHg (02/04 0800) SpO2:  [91 %-100 %] 93 % (02/04 0800) HEMODYNAMICS:   VENTILATOR SETTINGS:   INTAKE / OUTPUT: Intake/Output     02/03 0701 - 02/04 0700 02/04 0701 - 02/05 0700   P.O. 240    I.V. (mL/kg) 2687.5 (33.8) 125 (1.6)   IV Piggyback 850    Total Intake(mL/kg) 3777.5 (47.5) 125 (1.6)   Urine (mL/kg/hr) 2200 (1.2) 650 (2.2)   Total Output 2200 650   Net +1577.5 -525        Urine Occurrence 2 x 1 x   Stool Occurrence 2 x      PHYSICAL EXAMINATION: General:  Calmer , disheveled wm who appears older than 47 Neuro:  alert HEENT:  No LAN/JVD Cardiovascular:  HSR RRR Lungs:  Decreased bs.  Abdomen: Soft + bs Musculoskeletal:  intact Skin:  warm  LABS:  CBC  Recent Labs Lab 06/22/13 0215 06/23/13 0353 06/24/13 0330  WBC 3.0* 2.8* 4.4  HGB 12.7* 13.7 12.9*  HCT  38.4* 39.9 37.9*  PLT 50* 63* 67*   Coag's  Recent Labs Lab 06/21/13 1949 06/22/13 0215 06/23/13 0353  APTT  --   --  33  INR 1.25 1.39 1.27   BMET  Recent Labs Lab 06/22/13 0215 06/23/13 0353 06/24/13 0330  NA 136* 135* 132*  K 4.1 3.9 3.5*  CL 104 103 100  CO2 22 20 20   BUN 10 9 8   CREATININE 0.57 0.60 0.56  GLUCOSE 83 96 120*   Electrolytes  Recent Labs Lab 06/22/13 0215 06/23/13 0353 06/24/13 0330  CALCIUM 8.1* 7.8* 7.6*  MG  --  1.7  --   PHOS  --  2.4  --    Sepsis Markers No results found for this basename: LATICACIDVEN, PROCALCITON, O2SATVEN,  in the last 168 hours ABG No results found for this basename: PHART, PCO2ART, PO2ART,  in the last 168 hours Liver Enzymes  Recent Labs Lab 06/21/13 2120 06/22/13 0215 06/24/13 0330  AST 142* 134* 168*  ALT 95* 89* 115*  ALKPHOS 115 106 110  BILITOT 2.6* 2.4* 2.2*  ALBUMIN 3.4* 3.1* 2.8*   Cardiac Enzymes No results found for this basename: TROPONINI, PROBNP,  in the last 168 hours Glucose  Recent Labs Lab 06/21/13 2235 06/22/13 0154  GLUCAP 98 84  Imaging Dg Chest Port 1 View  06/23/2013   CLINICAL DATA:  Pneumonia  EXAM: PORTABLE CHEST - 1 VIEW  COMPARISON:  06/21/2013  FINDINGS: The cardiac shadow is stable. The lungs are well aerated bilaterally. Mild persistent changes are noted in the right middle lobe. No new focal infiltrate is seen. No acute bony abnormality is noted.  IMPRESSION: No significant change from the prior exam   Electronically Signed   By: Alcide Clever M.D.   On: 06/23/2013 07:39    ASSESSMENT / PLAN:  PULMONARY A:Suspected OSA RML PNA; consider post-obstructive lesion is not clearing in appropriate interval of 3-4 weeks P:   O2 as needed, he would not tolerate cpap at this time Will need PSG at some point in the future Abx as below Sputum culture if he is able to produce  CARDIOVASCULAR A: HTN, not yet on home meds P:  Timing for restart BP regimen will depend  on MS  RENAL A:  No acute issue P:    GASTROINTESTINAL A:  GI Protection      Cirrhosis P:   PPI   HEMATOLOGIC A:  No acute issue P:   INFECTIOUS A:  RML consolidation / PNA P:   Currently vanco + zosyn, would stop vanco 2/4, change to Po when he can tolerate  ENDOCRINE A:  No acute   P:     NEUROLOGIC A:  Sedated or agitated in setting of ETOH wd       Depression       Polysubstance abuse       Ammonia level 40->43 P:   Consider CT head when able Continue scheduled ativan and transition to prn only soon  TODAY'S SUMMARY:  Precedex off. PCCM will sign off  Brett Canales Minor ACNP Adolph Pollack PCCM Pager [phone] till 3 pm If no answer page [phone] 06/24/2013, 10:47 AM  Levy Pupa, MD, PhD 06/24/2013, 10:47 AM [facility] [phone] or if no answer [phone]

## 2013-06-24 NOTE — Progress Notes (Signed)
[facility] PROGRESS NOTE  Ronnie Mays ZOX:[id number] DOB: Oct 03, 1965 DOA: 06/21/2013 PCP: No primary provider on file. Brief narrative Ronnie OrtWally Balcerzak is a 48 y.o. WM PMHx  alcohol abuse, liver cirrhosis, depression , history of delirium tremens, who presented to the emergency room at [facility] on 06/16/2013 with alcohol intoxication, having a blood alcohol level of 325. He expressed wishes to undergo detox. Patient was admitted to behavioral health. on 06/21/2013 medicine was consulted for increased confusion, hallucinations, tremors. He is Ativan dose was increased as he was started on lactulose for hyperammonemia. despite these interventions, patient continued to have worsening of his encephalopathy. He was noted by staff at [facility] to have gait instability, incontinence, with increased confusion. Also noted has been increased coughing over the past several days. a chest x-ray performed on admission showed the presence of right middle lobe consolidation. During my encounter patient appears tremors, cannot tell me where we are or correctly identified the month or year. 2/2 patient sleeping peacefully, restrained. Medical staff relates to me that overnight patient was belligerent, resulting medical staff. Security was called patient to be physically and chemically restrained. Chemical restraint (Precedex) was DC'd at 0640 this a.m. 2/3 patient reporting hemoptysis x1 month with some shortness of breath. Assessment/Plan:  HCAP vs aspiration pneumonia -CXR demonstrated RML consolidation>> more likely aspiration given his history -Continue vancomycin and Zosyn -Blood culture; NGTD, sputum cultures pending -Continue Flutter valve, DuoNeb, Mucinex -Temperature 100.7 overnight, will continue current antibiotics for now  Alcohol withdrawal/delirium tremens. -Patient presenting in DTs, transfer from Behavioral health. -presented on 06/16/2013 intoxicated, developing signs and symptoms  of alcohol withdrawal over the last 24 hours which has progressively worsened. Admitted to step down unit, -Continue alcohol withdrawal protocol using CIWA scale.  -Continue thiamine, folate and IV fluid resuscitation.  -Appreciate psychiatry input, per Dr. Lolly MustacheArfeen he has capacity [facility]  Hyperammonemia.  -Admission ammonia level= 82umol/L; 2/3 ammonia level=43 umol/L. -Continue lactulose therapy.  -clinically improved, Continue lactulose therapy to 20 g daily.   Elevated transaminases. - Likely secondary to alcohol abuse. Trending down  -Monitor closely   HTN  - Patient now awake and able to by mouth medication DC  metoprolol 5 mg IV  TID -Metoprolol 12.5 bid -Lisinopril 5 mg daily     Substance abuse -Alcohol and benzodiazepine   Hemoptysis -More likely due to #1, continue antibiotics as above -await TB QuantiFERON, low clinical suspicion for TB, his pneumonia is more likely aspiration related -continue Protonix to 40 mg BID -Occult blood card -Monitor closely hemoglobin level  Depression -Restart Zoloft 50 mg daily    Code Status: Full Family Communication: None available Disposition Plan: Monitor instead down today   Consultants: Psychiatry consult pending   Procedures: 2/2 MRSA by PCR negative  06/21/2013 CXR Right middle lobe consolidation. This could represent pneumonitis.    Antibiotics: Zosyn 2/2 Vancomycin 2/2    HPI/Subjective:  Denies any complaints this a.m. but per nursing, some restlessness     Objective: Filed Vitals:   06/23/13 2130 06/24/13 0000 06/24/13 0400 06/24/13 0800  BP: 158/98 128/72 121/75 134/81  Pulse: 79 78 73 72  Temp:  100.7 F (38.2 C) 99.7 F (37.6 C) 98.1 F (36.7 C)  TempSrc:  Oral Oral Oral  Resp:   21 17  Height:      Weight:      SpO2:   100% 93%  Intake/Output Summary (Last 24 hours) at 06/24/13 1026 Last data  filed at 06/24/13 1000  Gross per 24 hour  Intake 3527.5 ml  Output   2850 ml  Net  677.5 ml   Filed Weights   06/22/13 0134 06/22/13 0135 06/23/13 0400  Weight: 79 kg (174 lb 2.6 oz) 74.8 kg (164 lb 14.5 oz) 79.5 kg (175 lb 4.3 oz)    Exam:   General:  A./O to person and time, NAD, decreased tremors   Cardiovascular: Regular rhythm and rate, negative murmurs rubs gallops  Respiratory: Clear to auscultation bilateral  Abdomen: Soft, nontender, nondistended, plus bowel sounds  Musculoskeletal: Negative pedal edema, negative ecchymosis negative cyanosis   Data Reviewed: Basic Metabolic Panel:  Recent Labs Lab 06/21/13 1949 06/21/13 2120 06/22/13 0215 06/23/13 0353 06/24/13 0330  NA 133* 136* 136* 135* 132*  K 4.5 4.2 4.1 3.9 3.5*  CL 99 101 104 103 100  CO2 21 24 22 20 20   GLUCOSE 92 103* 83 96 120*  BUN 13 12 10 9 8   CREATININE 0.66 0.66 0.57 0.60 0.56  CALCIUM 9.4 9.1 8.1* 7.8* 7.6*  MG  --   --   --  1.7  --   PHOS  --   --   --  2.4  --    Liver Function Tests:  Recent Labs Lab 06/21/13 0411 06/21/13 1949 06/21/13 2120 06/22/13 0215 06/24/13 0330  AST 151* 158* 142* 134* 168*  ALT 99* 102* 95* 89* 115*  ALKPHOS 140* 125* 115 106 110  BILITOT 2.5* 2.7* 2.6* 2.4* 2.2*  PROT 8.2 8.7* 8.2 7.5 7.0  ALBUMIN 3.4* 3.7 3.4* 3.1* 2.8*   No results found for this basename: LIPASE, AMYLASE,  in the last 168 hours  Recent Labs Lab 06/21/13 0411 06/21/13 1949 06/21/13 2033 06/22/13 1045  AMMONIA 82* RESULTS UNAVAILABLE DUE TO INTERFERING SUBSTANCE 40 43   CBC:  Recent Labs Lab 06/21/13 2120 06/22/13 0215 06/23/13 0353 06/24/13 0330  WBC 3.1* 3.0* 2.8* 4.4  NEUTROABS 1.5* 1.4*  --  2.5  HGB 13.0 12.7* 13.7 12.9*  HCT 38.8* 38.4* 39.9 37.9*  MCV 104.3* 105.2* 103.6* 101.9*  PLT 59* 50* 63* 67*   Cardiac Enzymes: No results found for this basename: CKTOTAL, CKMB, CKMBINDEX, TROPONINI,  in the last 168 hours BNP (last 3 results) No results  found for this basename: PROBNP,  in the last 8760 hours CBG:  Recent Labs Lab 06/21/13 2235 06/22/13 0154  GLUCAP 98 84    Recent Results (from the past 240 hour(s))  MRSA PCR SCREENING     Status: None   Collection Time    06/22/13  1:35 AM      Result Value Range Status   MRSA by PCR NEGATIVE  NEGATIVE Final   Comment:            The GeneXpert MRSA Assay (FDA     approved for NASAL specimens     only), is one component of a     comprehensive MRSA colonization     surveillance program. It is not     intended to diagnose MRSA     infection nor to guide or     monitor treatment for     MRSA infections.  CULTURE, BLOOD (ROUTINE X 2)     Status: None   Collection Time    06/22/13  2:15 AM      Result Value Range Status   Specimen Description BLOOD LEFT HAND  Final   Special Requests BOTTLES DRAWN AEROBIC AND ANAEROBIC [facility]   Final   Culture  Setup Time     Final   Value: 06/22/2013 08:35     Performed at [facility]   Culture     Final   Value:        BLOOD CULTURE RECEIVED NO GROWTH TO DATE CULTURE WILL BE HELD FOR 5 DAYS BEFORE ISSUING A FINAL NEGATIVE REPORT     Performed at [facility]   Report Status PENDING   Incomplete  CULTURE, BLOOD (ROUTINE X 2)     Status: None   Collection Time    06/22/13  2:20 AM      Result Value Range Status   Specimen Description BLOOD LEFT ARM   Final   Special Requests BOTTLES DRAWN AEROBIC AND ANAEROBIC 6CC   Final   Culture  Setup Time     Final   Value: 06/22/2013 08:35     Performed at [facility]   Culture     Final   Value:        BLOOD CULTURE RECEIVED NO GROWTH TO DATE CULTURE WILL BE HELD FOR 5 DAYS BEFORE ISSUING A FINAL NEGATIVE REPORT     Performed at [facility]   Report Status PENDING   Incomplete     Studies: Dg Chest Port 1 View  06/23/2013   CLINICAL DATA:  Pneumonia  EXAM: PORTABLE CHEST - 1 VIEW  COMPARISON:  06/21/2013  FINDINGS: The cardiac shadow is stable. The lungs are  well aerated bilaterally. Mild persistent changes are noted in the right middle lobe. No new focal infiltrate is seen. No acute bony abnormality is noted.  IMPRESSION: No significant change from the prior exam   Electronically Signed   By: Alcide Clever M.D.   On: 06/23/2013 07:39    Scheduled Meds: . dextromethorphan-guaiFENesin  1 tablet Oral BID  . folic acid  1 mg Oral Daily  . gabapentin  300 mg Oral BID  . gabapentin  600 mg Oral QHS  . lactulose  20 g Oral Daily  . lisinopril  5 mg Oral Daily  . LORazepam  1 mg Intravenous Q6H  . metoprolol tartrate  12.5 mg Oral BID  . multivitamin with minerals  1 tablet Oral Daily  . pantoprazole  40 mg Oral BID WC  . piperacillin-tazobactam (ZOSYN)  IV  3.375 g Intravenous Q8H  . sertraline  50 mg Oral Daily  . thiamine  100 mg Oral Daily   Or  . thiamine  100 mg Intravenous Daily  . vancomycin  1,250 mg Intravenous Q8H   Continuous Infusions: . sodium chloride 125 mL/hr at 06/23/13 1930    Active Problems:   Alcohol withdrawal   Depressive disorder   Alcohol dependence   Acute encephalopathy   Substance abuse   HTN (hypertension)   Elevated transaminase level    Time spent: 35 minute    Jaspreet Hollings C  [facility] Pager [phone]. If 7PM-7AM, please contact night-coverage at [email or web address], password [email or web address] 06/24/2013, 10:26 AM  LOS: 3 days

## 2013-06-24 NOTE — Progress Notes (Signed)
Clinical Social Work  CSW attempted to meet with patient in order to complete assessment. Per chart review, psych MD is [facility]. Patient's room had signs asking for no visitors. RN reports ruling out illnesses for airborne precautions and patient remains in restraints. CSW will follow up at more appropriate time to complete assessment.  Westbrook[place]olly Latissa Frick, Kentucky[place]CSW [phone]

## 2013-06-24 NOTE — Clinical Social Work Note (Signed)
Pt [facility] on Sunday [facility]. Pt has [facility] screening and possible admission (if medically stable) on 07/01/13 and must reschedule his [facility] hospital follow up appt when he discharges from hospital (see AVS).   The Sherwin-WilliamsHeather Smart, LCSWA 06/24/2013 1:15 PM

## 2013-06-25 LAB — CBC WITH DIFFERENTIAL/PLATELET
BASOS ABS: 0.1 10*3/uL (ref 0.0–0.1)
Basophils Relative: 2 % — ABNORMAL HIGH (ref 0–1)
Eosinophils Absolute: 0.1 10*3/uL (ref 0.0–0.7)
Eosinophils Relative: 3 % (ref 0–5)
HCT: 38.4 % — ABNORMAL LOW (ref 39.0–52.0)
Hemoglobin: 12.9 g/dL — ABNORMAL LOW (ref 13.0–17.0)
Lymphocytes Relative: 23 % (ref 12–46)
Lymphs Abs: 0.9 10*3/uL (ref 0.7–4.0)
MCH: 34.4 pg — ABNORMAL HIGH (ref 26.0–34.0)
MCHC: 33.6 g/dL (ref 30.0–36.0)
MCV: 102.4 fL — ABNORMAL HIGH (ref 78.0–100.0)
Monocytes Absolute: 1 10*3/uL (ref 0.1–1.0)
Monocytes Relative: 28 % — ABNORMAL HIGH (ref 3–12)
NEUTROS ABS: 1.7 10*3/uL (ref 1.7–7.7)
Neutrophils Relative %: 45 % (ref 43–77)
Platelets: 74 10*3/uL — ABNORMAL LOW (ref 150–400)
RBC: 3.75 MIL/uL — ABNORMAL LOW (ref 4.22–5.81)
RDW: 14 % (ref 11.5–15.5)
WBC: 3.8 10*3/uL — ABNORMAL LOW (ref 4.0–10.5)

## 2013-06-25 LAB — COMPREHENSIVE METABOLIC PANEL
ALT: 107 U/L — AB (ref 0–53)
AST: 155 U/L — ABNORMAL HIGH (ref 0–37)
Albumin: 2.9 g/dL — ABNORMAL LOW (ref 3.5–5.2)
Alkaline Phosphatase: 101 U/L (ref 39–117)
BUN: 6 mg/dL (ref 6–23)
CO2: 21 meq/L (ref 19–32)
Calcium: 8 mg/dL — ABNORMAL LOW (ref 8.4–10.5)
Chloride: 102 mEq/L (ref 96–112)
Creatinine, Ser: 0.54 mg/dL (ref 0.50–1.35)
Glucose, Bld: 138 mg/dL — ABNORMAL HIGH (ref 70–99)
POTASSIUM: 4.7 meq/L (ref 3.7–5.3)
SODIUM: 133 meq/L — AB (ref 137–147)
Total Bilirubin: 2.1 mg/dL — ABNORMAL HIGH (ref 0.3–1.2)
Total Protein: 7.6 g/dL (ref 6.0–8.3)

## 2013-06-25 LAB — EXPECTORATED SPUTUM ASSESSMENT W REFEX TO RESP CULTURE: Special Requests: NORMAL

## 2013-06-25 LAB — VANCOMYCIN, TROUGH: Vancomycin Tr: 12.2 ug/mL (ref 10.0–20.0)

## 2013-06-25 LAB — EXPECTORATED SPUTUM ASSESSMENT W GRAM STAIN, RFLX TO RESP C

## 2013-06-25 MED ORDER — VANCOMYCIN HCL 10 G IV SOLR
1500.0000 mg | Freq: Three times a day (TID) | INTRAVENOUS | Status: DC
  Administered 2013-06-25: 1500 mg via INTRAVENOUS
  Filled 2013-06-25 (×2): qty 1500

## 2013-06-25 MED ORDER — AMOXICILLIN-POT CLAVULANATE 875-125 MG PO TABS
1.0000 | ORAL_TABLET | Freq: Two times a day (BID) | ORAL | Status: DC
  Administered 2013-06-26: 1 via ORAL
  Filled 2013-06-25 (×2): qty 1

## 2013-06-25 NOTE — Progress Notes (Signed)
Pt arrived from ICU in w/c. Stood up to bed w/ stand by assist. VSS. Pt oriented to calbell and environment. Tele box 23 confirmed w/ CCMD. Pt denies pain, resting quietly in bed. Wife bedside.

## 2013-06-25 NOTE — Progress Notes (Signed)
ANTIBIOTIC CONSULT NOTE - FOLLOW UP  Pharmacy Consult for Vancomycin Indication: HCAP vs. Aspiration PNA  Allergies  Allergen Reactions  . Trazodone And Nefazodone     Priapism.     Patient Measurements: Height: 5\' 10"  (177.8 cm) Weight: 175 lb 4.3 oz (79.5 kg) IBW/kg (Calculated) : 73  Vital Signs: Temp: 97.8 F (36.6 C) (02/05 1200) Temp src: Oral (02/05 1200) BP: 144/107 mmHg (02/05 1000) Pulse Rate: 65 (02/05 1300) Intake/Output from previous day: 02/04 0701 - 02/05 0700 In: 4055.4 [P.O.:840; I.V.:2397.9; IV Piggyback:817.5] Out: 4975 [Urine:4975] Intake/Output from this shift: Total I/O In: 369.2 [P.O.:240; I.V.:45.8; IV Piggyback:83.4] Out: 2000 [Urine:2000]  Labs:  Recent Labs  06/23/13 0353 06/24/13 0330 06/25/13 0318 06/25/13 0530  WBC 2.8* 4.4  --  3.8*  HGB 13.7 12.9*  --  12.9*  PLT 63* 67*  --  74*  CREATININE 0.60 0.56 0.54  --    Estimated Creatinine Clearance: 119.1 ml/min (by C-G formula based on Cr of 0.54).  Recent Labs  06/23/13 1810  VANCOTROUGH 10.8     Microbiology: Recent Results (from the past 720 hour(s))  MRSA PCR SCREENING     Status: None   Collection Time    06/22/13  1:35 AM      Result Value Range Status   MRSA by PCR NEGATIVE  NEGATIVE Final   Comment:            The GeneXpert MRSA Assay (FDA     approved for NASAL specimens     only), is one component of a     comprehensive MRSA colonization     surveillance program. It is not     intended to diagnose MRSA     infection nor to guide or     monitor treatment for     MRSA infections.  CULTURE, BLOOD (ROUTINE X 2)     Status: None   Collection Time    06/22/13  2:15 AM      Result Value Range Status   Specimen Description BLOOD LEFT HAND   Final   Special Requests BOTTLES DRAWN AEROBIC AND ANAEROBIC [facility]   Final   Culture  Setup Time     Final   Value: 06/22/2013 08:35     [facility]formed at [facility]   Culture     Final   Value:        BLOOD CULTURE  RECEIVED NO GROWTH TO DATE CULTURE WILL BE HELD FOR 5 DAYS BEFORE ISSUING A FINAL NEGATIVE REPORT     [facility]   Report Status PENDING   Incomplete  CULTURE, BLOOD (ROUTINE X 2)     Status: None   Collection Time    06/22/13  2:20 AM      Result Value Range Status   Specimen Description BLOOD LEFT ARM   Final   Special Requests BOTTLES DRAWN AEROBIC AND ANAEROBIC 6CC   Final   Culture  Setup Time     Final   Value: 06/22/2013 08:35     [facility] at [facility]   Culture     Final   Value:        BLOOD CULTURE RECEIVED NO GROWTH TO DATE CULTURE WILL BE HELD FOR 5 DAYS BEFORE ISSUING A FINAL NEGATIVE REPORT     [facility]formed at [facility]   Report Status PENDING   Incomplete  CULTURE, EXPECTORATED SPUTUM-ASSESSMENT     Status: None   Collection Time  06/25/13 10:22 AM      Result Value Range Status   Specimen Description SPUTUM   Final   Special Requests Normal   Final   Sputum evaluation     Final   Value: THIS SPECIMEN IS ACCEPTABLE. RESPIRATORY CULTURE REPORT TO FOLLOW.   Report Status 06/25/2013 FINAL   Final    Anti-infectives: 2/1 >>Rocephin x 1 >>2/1 2/1 >>Azith x 1 >> 2/1 2/2 >> Zosyn >> 2/2 >> Vanc >>  Assessment: 48 yr male brought over from [facility] where being treated for alcohol withdrawal. Pt with hepatic encephalopathy and with a right middle lobe consolidation per chest xray.   Day #4 Vancomycin and Zosyn 3.375g IV q8h  Vancomycin trough low (10.8) on 2/2, dose increased from 1g q8h to 1250mg  q8h  Tmax 99.7  WBC low 3.8  SCr wnl, CrCl > 100 ml/min  Bacterial cultures negative to date  Goal of Therapy:  Vancomycin trough level 15-20 mcg/ml  Plan:   Check Vancomycin trough tonight before 6th dose of new regimen  Continue Zosyn 3.375gm IV q8h (4hr extended infusions) per MD Follow up renal function & cultures, de-escalation of therapy  Can vancomycin be dc'd since no MRSA isolated?  Loralee Pacas, PharmD,  BCPS Pager: [phone] 06/25/2013,2:04 PM

## 2013-06-25 NOTE — Progress Notes (Addendum)
[facility] PROGRESS NOTE  Ronnie Mays [id number]:[id number] DOB: [birth year] DOA: 06/21/2013 PCP: No primary provider on file. Brief narrative Ronnie Mays is a 48 y.o. WM PMHx  alcohol abuse, liver cirrhosis, depression , history of delirium tremens, who presented to the emergency room at [facility] on 06/16/2013 with alcohol intoxication, having a blood alcohol level of 325. He expressed wishes to undergo detox. Patient was admitted to behavioral health. on 06/21/2013 medicine was consulted for increased confusion, hallucinations, tremors. He is Ativan dose was increased as he was started on lactulose for hyperammonemia. despite these interventions, patient continued to have worsening of his encephalopathy. He was noted by staff at [facility] to have gait instability, incontinence, with increased confusion. Also noted has been increased coughing over the past several days. a chest x-ray performed on admission showed the presence of right middle lobe consolidation. During my encounter patient appears tremors, cannot tell me where we are or correctly identified the month or year. 2/2 patient sleeping peacefully, restrained. Medical staff relates to me that overnight patient was belligerent, resulting medical staff. Security was called patient to be physically and chemically restrained. Chemical restraint (Precedex) was DC'd at 0640 this a.m. 2/3 patient reporting hemoptysis x1 month with some shortness of breath. Assessment/Plan:  HCAP vs aspiration pneumonia -CXR demonstrated RML consolidation>> more likely aspiration given his history -Blood culture remain NGTD, sputum cultures pending -Continue Flutter valve, DuoNeb, Mucinex -He has defervesced and is clinically much improved -Will deescalate antibiotics, will change zosyn to Augmentin beginning in a.m. and DC vancomycin -quantiferon TB test negative, will DC airborne isolation  Alcohol withdrawal/delirium tremens. -Patient  presenting in DTs, transfer from Behavioral health. -presented on 06/16/2013 intoxicated, developing signs and symptoms of alcohol withdrawal over the last 24 hours which has progressively worsened. Admitted to step down unit, -Continue alcohol withdrawal protocol using CIWA scale.  -Continue thiamine, folate and IV fluid resuscitation.  -Appreciate psychiatry input, per Dr. Lolly Mustache he has capacity and wants to go to [facility] for treatment when medically cleared -Psychiatry followup with patient for disposition as he now not want to go for inpatient treatment  Hyperammonemia.  -Admission ammonia level= 82umol/L; 2/3 ammonia level=43 umol/L. -Continue lactulose therapy.  -clinically improved, Continue lactulose  Elevated transaminases. - Likely secondary to alcohol abuse. Trending down  -Monitor closely   HTN  - Patient now awake and able to by mouth medication DC  metoprolol 5 mg IV  TID -Metoprolol 12.5 bid -Lisinopril 5 mg daily     Substance abuse -Alcohol and benzodiazepine   Hemoptysis -More likely due to #1, continue antibiotics as above -await TB QuantiFERON, low clinical suspicion for TB, his pneumonia is more likely aspiration related -continue Protonix to 40 mg BID -Occult blood card -Monitor closely hemoglobin level  Depression -Continue Zoloft 50 mg daily    Code Status: Full Family Communication: None available Disposition Plan: Transfer to telemetry   Consultants: Psychiatry consult pending   Procedures: 2/2 MRSA by PCR negative  06/21/2013 CXR Right middle lobe consolidation. This could represent pneumonitis.    Antibiotics: Zosyn 2/2>>2/5 Vancomycin 2/2>>2/5    HPI/Subjective: Ronnie Mays alert and appropriate, discussed  Ronnie Mays, but his asking about outpatient treatment     Objective: Filed Vitals:   06/25/13 1200 06/25/13 1300 06/25/13 1400 06/25/13 1449  BP:    153/89  Pulse: 67 65 69 63  Temp: 97.8 F (36.6 C)   98 F (36.7 C)  TempSrc: Oral    Oral  Resp: 17 15  18  Height:    5\' 10"  (1.778 m)  Weight:    76.2 kg (167 lb 15.9 oz)  SpO2: 95% 94% 98% 99%    Intake/Output Summary (Last 24 hours) at 06/25/13 1856 Last data filed at 06/25/13 1551  Gross per 24 hour  Intake 4116.72 ml  Output   4975 ml  Net -858.28 ml   Filed Weights   06/22/13 0135 06/23/13 0400 06/25/13 1449  Weight: 74.8 kg (164 lb 14.5 oz) 79.5 kg (175 lb 4.3 oz) 76.2 kg (167 lb 15.9 oz)    Exam:   General:  A./O to person and time, NAD, decreased tremors   Cardiovascular: Regular rhythm and rate, negative murmurs rubs gallops  Respiratory: Clear to auscultation bilateral  Abdomen: Soft, nontender, nondistended, plus bowel sounds  Musculoskeletal: Negative pedal edema, negative ecchymosis negative cyanosis   Data Reviewed: Basic Metabolic Panel:  Recent Labs Lab 06/21/13 2120 06/22/13 0215 06/23/13 0353 06/24/13 0330 06/25/13 0318  NA 136* 136* 135* 132* 133*  K 4.2 4.1 3.9 3.5* 4.7  CL 101 104 103 100 102  CO2 24 22 20 20 21   GLUCOSE 103* 83 96 120* 138*  BUN 12 10 9 8 6   CREATININE 0.66 0.57 0.60 0.56 0.54  CALCIUM 9.1 8.1* 7.8* 7.6* 8.0*  MG  --   --  1.7  --   --   PHOS  --   --  2.4  --   --    Liver Function Tests:  Recent Labs Lab 06/21/13 1949 06/21/13 2120 06/22/13 0215 06/24/13 0330 06/25/13 0318  AST 158* 142* 134* 168* 155*  ALT 102* 95* 89* 115* 107*  ALKPHOS 125* 115 106 110 101  BILITOT 2.7* 2.6* 2.4* 2.2* 2.1*  PROT 8.7* 8.2 7.5 7.0 7.6  ALBUMIN 3.7 3.4* 3.1* 2.8* 2.9*   No results found for this basename: LIPASE, AMYLASE,  in the last 168 hours  Recent Labs Lab 06/21/13 0411 06/21/13 1949 06/21/13 2033 06/22/13 1045  AMMONIA 82* RESULTS UNAVAILABLE DUE TO INTERFERING SUBSTANCE 40 43   CBC:  Recent Labs Lab 06/21/13 2120 06/22/13 0215 06/23/13 0353 06/24/13 0330 06/25/13 0530  WBC 3.1* 3.0* 2.8* 4.4 3.8*  NEUTROABS 1.5* 1.4*  --  2.5 1.7  HGB 13.0 12.7* 13.7 12.9* 12.9*  HCT 38.8*  38.4* 39.9 37.9* 38.4*  MCV 104.3* 105.2* 103.6* 101.9* 102.4*  PLT 59* 50* 63* 67* 74*   Cardiac Enzymes: No results found for this basename: CKTOTAL, CKMB, CKMBINDEX, TROPONINI,  in the last 168 hours BNP (last 3 results) No results found for this basename: PROBNP,  in the last 8760 hours CBG:  Recent Labs Lab 06/21/13 2235 06/22/13 0154  GLUCAP 98 84    Recent Results (from the past 240 hour(s))  MRSA PCR SCREENING     Status: None   Collection Time    06/22/13  1:35 AM      Result Value Range Status   MRSA by PCR NEGATIVE  NEGATIVE Final   Comment:            The GeneXpert MRSA Assay (FDA     approved for NASAL specimens     only), is one component of a     comprehensive MRSA colonization     surveillance program. It is not     intended to diagnose MRSA     infection nor to guide or     monitor treatment for     MRSA infections.  CULTURE, BLOOD (ROUTINE  X 2)     Status: None   Collection Time    06/22/13  2:15 AM      Result Value Range Status   Specimen Description BLOOD LEFT HAND   Final   Special Requests BOTTLES DRAWN AEROBIC AND ANAEROBIC [facility]CC   Final   Culture  Setup Time     Final   Value: 06/22/2013 08:35     Performed at [facility]   Culture     Final   Value:        BLOOD CULTURE RECEIVED NO GROWTH TO DATE CULTURE WILL BE HELD FOR 5 DAYS BEFORE ISSUING A FINAL NEGATIVE REPORT     Performed at [facility]   Report Status PENDING   Incomplete  CULTURE, BLOOD (ROUTINE X 2)     Status: None   Collection Time    06/22/13  2:20 AM      Result Value Range Status   Specimen Description BLOOD LEFT ARM   Final   Special Requests BOTTLES DRAWN AEROBIC AND ANAEROBIC 6CC   Final   Culture  Setup Time     Final   Value: 06/22/2013 08:35     Performed at [facility]   Culture     Final   Value:        BLOOD CULTURE RECEIVED NO GROWTH TO DATE CULTURE WILL BE HELD FOR 5 DAYS BEFORE ISSUING A FINAL NEGATIVE REPORT     Performed at  [facility]   Report Status PENDING   Incomplete  CULTURE, EXPECTORATED SPUTUM-ASSESSMENT     Status: None   Collection Time    06/25/13 10:22 AM      Result Value Range Status   Specimen Description SPUTUM   Final   Special Requests Normal   Final   Sputum evaluation     Final   Value: THIS SPECIMEN IS ACCEPTABLE. RESPIRATORY CULTURE REPORT TO FOLLOW.   Report Status 06/25/2013 FINAL   Final     Studies: No results found.  Scheduled Meds: . dextromethorphan-guaiFENesin  1 tablet Oral BID  . folic acid  1 mg Oral Daily  . gabapentin  300 mg Oral BID  . gabapentin  600 mg Oral QHS  . lactulose  20 g Oral Daily  . lisinopril  5 mg Oral Daily  . LORazepam  1 mg Intravenous Q6H  . metoprolol tartrate  12.5 mg Oral BID  . multivitamin with minerals  1 tablet Oral Daily  . nicotine  21 mg Transdermal Daily  . pantoprazole  40 mg Oral BID WC  . piperacillin-tazobactam (ZOSYN)  IV  3.375 g Intravenous Q8H  . sertraline  50 mg Oral Daily  . thiamine  100 mg Oral Daily   Or  . thiamine  100 mg Intravenous Daily  . vancomycin  1,500 mg Intravenous Q8H   Continuous Infusions: . sodium chloride 125 mL/hr at 06/25/13 1501    Active Problems:   Alcohol withdrawal   Depressive disorder   Alcohol dependence   Acute encephalopathy   Substance abuse   HTN (hypertension)   Elevated transaminase level    Time spent: 25 minute    Ronnie Mays C  [facility] Pager [phone]. If 7PM-7AM, please contact night-coverage at [email or web address], password [email or web address] 06/25/2013, 6:56 PM  LOS: 4 days
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
## 2013-06-25 NOTE — Progress Notes (Signed)
Brief Pharmacy Note - Vancomycin Follow Up  Labs: vanc trough 12.2  A/P: Vanc trough subtherapeutic (goal 15-20) on dose of 1250mg  IV q8. Will increase dose to 1500mg  IV q8 and recheck trough as necessary  Hessie KnowsJustin M Santiago Graf, PharmD, BCPS Pager [phone] 06/25/2013 3:12 PM

## 2013-06-26 DIAGNOSIS — R404 Transient alteration of awareness: Secondary | ICD-10-CM

## 2013-06-26 DIAGNOSIS — F329 Major depressive disorder, single episode, unspecified: Secondary | ICD-10-CM

## 2013-06-26 LAB — CBC WITH DIFFERENTIAL/PLATELET
BASOS PCT: 2 % — AB (ref 0–1)
Basophils Absolute: 0.1 10*3/uL (ref 0.0–0.1)
Eosinophils Absolute: 0.1 10*3/uL (ref 0.0–0.7)
Eosinophils Relative: 4 % (ref 0–5)
HEMATOCRIT: 37 % — AB (ref 39.0–52.0)
Hemoglobin: 12.3 g/dL — ABNORMAL LOW (ref 13.0–17.0)
LYMPHS ABS: 1 10*3/uL (ref 0.7–4.0)
Lymphocytes Relative: 30 % (ref 12–46)
MCH: 34 pg (ref 26.0–34.0)
MCHC: 33.2 g/dL (ref 30.0–36.0)
MCV: 102.2 fL — ABNORMAL HIGH (ref 78.0–100.0)
MONOS PCT: 19 % — AB (ref 3–12)
Monocytes Absolute: 0.6 10*3/uL (ref 0.1–1.0)
NEUTROS ABS: 1.5 10*3/uL — AB (ref 1.7–7.7)
NEUTROS PCT: 47 % (ref 43–77)
PLATELETS: 94 10*3/uL — AB (ref 150–400)
RBC: 3.62 MIL/uL — ABNORMAL LOW (ref 4.22–5.81)
RDW: 14.1 % (ref 11.5–15.5)
WBC: 3.3 10*3/uL — AB (ref 4.0–10.5)

## 2013-06-26 LAB — COMPREHENSIVE METABOLIC PANEL WITH GFR
ALT: 93 U/L — ABNORMAL HIGH (ref 0–53)
AST: 118 U/L — ABNORMAL HIGH (ref 0–37)
Albumin: 2.7 g/dL — ABNORMAL LOW (ref 3.5–5.2)
Alkaline Phosphatase: 103 U/L (ref 39–117)
BUN: 8 mg/dL (ref 6–23)
CO2: 22 meq/L (ref 19–32)
Calcium: 8.1 mg/dL — ABNORMAL LOW (ref 8.4–10.5)
Chloride: 104 meq/L (ref 96–112)
Creatinine, Ser: 0.73 mg/dL (ref 0.50–1.35)
GFR calc Af Amer: >90 mL/min
GFR calc non Af Amer: >90 mL/min
Glucose, Bld: 104 mg/dL — ABNORMAL HIGH (ref 70–99)
Potassium: 4 meq/L (ref 3.7–5.3)
Sodium: 136 meq/L — ABNORMAL LOW (ref 137–147)
Total Bilirubin: 1.6 mg/dL — ABNORMAL HIGH (ref 0.3–1.2)
Total Protein: 6.9 g/dL (ref 6.0–8.3)

## 2013-06-26 MED ORDER — LORAZEPAM 1 MG PO TABS
1.0000 mg | ORAL_TABLET | Freq: Once | ORAL | Status: AC
  Administered 2013-06-26: 1 mg via ORAL
  Filled 2013-06-26 (×2): qty 1

## 2013-06-26 MED ORDER — LORAZEPAM 1 MG PO TABS: ORAL_TABLET | ORAL | Status: DC

## 2013-06-26 MED ORDER — DM-GUAIFENESIN ER 30-600 MG PO TB12: 1.0000 | ORAL_TABLET | Freq: Two times a day (BID) | ORAL | Status: DC

## 2013-06-26 MED ORDER — PANTOPRAZOLE SODIUM 40 MG PO TBEC: 40.0000 mg | DELAYED_RELEASE_TABLET | Freq: Two times a day (BID) | ORAL | Status: DC

## 2013-06-26 MED ORDER — AMOXICILLIN-POT CLAVULANATE 875-125 MG PO TABS: 1.0000 | ORAL_TABLET | Freq: Two times a day (BID) | ORAL | Status: DC

## 2013-06-26 MED ORDER — METOPROLOL TARTRATE 12.5 MG HALF TABLET: 12.5000 mg | ORAL_TABLET | Freq: Two times a day (BID) | ORAL | Status: DC

## 2013-06-26 MED ORDER — THIAMINE HCL 100 MG PO TABS: 100.0000 mg | ORAL_TABLET | Freq: Every day | ORAL | Status: DC

## 2013-06-26 MED ORDER — LACTULOSE 10 GM/15ML PO SOLN: 20.0000 g | Freq: Every day | ORAL | Status: DC

## 2013-06-26 MED ORDER — FOLIC ACID 1 MG PO TABS: 1.0000 mg | ORAL_TABLET | Freq: Every day | ORAL | Status: DC

## 2013-06-26 MED ORDER — ADULT MULTIVITAMIN W/MINERALS CH: 1.0000 | ORAL_TABLET | Freq: Every day | ORAL | Status: DC

## 2013-06-26 MED ORDER — BOOST PLUS PO LIQD
237.0000 mL | Freq: Two times a day (BID) | ORAL | Status: DC
  Administered 2013-06-26: 237 mL via ORAL
  Filled 2013-06-26: qty 237

## 2013-06-26 MED ORDER — LISINOPRIL 5 MG PO TABS: 5.0000 mg | ORAL_TABLET | Freq: Every day | ORAL | Status: DC
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
## 2013-06-26 NOTE — Progress Notes (Signed)
Information given to pt to call for an appointment with [facility]. This CM tried calling  [facility] several times with no answer, left VM.  
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
## 2013-06-26 NOTE — Discharge Summary (Signed)
Physician Discharge Summary  Ronnie Mays Valone NWG:[id number] DOB: [birth year] DOA: 06/21/2013  PCP: No primary provider on file.  Admit date: 06/21/2013 Discharge date: 06/26/2013  Time spent: >30 minutes Recommendations outpatient followup Follow-up Information   [facility] with [facility] On 07/01/2013. (Arrive by 8am with ID ([place]), 14 day medication supply, and clothing for screening and possible admission into facility. If you must reschedule screening please call [facility] as soon as possible to do so. )    Contact information:   [address] [place], [place] [address] Phone: [phone] Fax: [phone]      Follow up with [facility]. (Please call [facility] when you discharge from the hospital to schedule hospital follow-up if not already scheduled. )    Contact information:   [address] [place], [place] [address] Phone: [phone] Fax: [phone]      Please follow up. (Psyhiatrist as directed, call for appt upon discharge)       Follow up with Leslye PeerBYRUM,ROBERT S., MD. (in 3-4weeks, call for appt upon discharge)    Specialty:  Pulmonary Disease   Contact information:   [address] [place] [address] [phone]       Please follow up. (PCP IN 1WEEK, CALL for appt upon discharge)        followup studies with PCP  [facility]  >> he is to have followup chest x-ray in 3-4 weeks as discussed below           Followup for polysomnogram with pulmonologist   Discharge Diagnoses:  Active Problems:   Alcohol withdrawal   Depressive disorder   Alcohol dependence   Acute encephalopathy   Substance abuse   HTN (hypertension)   Elevated transaminase level   Discharge Condition: Improved/stable  Diet recommendation: Low sodium  Filed Weights   06/22/13 0135 06/23/13 0400 06/25/13 1449  Weight: 74.8 kg (164 lb 14.5 oz) 79.5 kg (175 lb 4.3 oz) 76.2 kg (167 lb 15.9 oz)    History of  present illness:  Ronnie Mays Siedlecki is a 48 y.o. male patient is a 48 year old with a past medical history of alcohol abuse, cirrhosis, history of delirium tremens, who presented to the emergency room at [facility] on 06/16/2013 with alcohol intoxication, having a blood alcohol level of 325. He expressed wishes to undergo detox. Patient was admitted to behavioral health. on 06/21/2013 medicine was consulted for increased confusion, hallucinations, tremors. He is Ativan dose was increased as he was started on lactulose for hyperammonemia. despite these interventions, patient continued to have worsening of his encephalopathy. He was noted by staff at [facility] to have gait instability, incontinence, with increased confusion. Also noted has been increased coughing over the past several days. a chest x-ray performed on admission showed the presence of right middle lobe consolidation. During my encounter patient appears tremors, cannot tell me where we are or correctly identified the month or year. He was [facility] for further evaluation and management.   Hospital Course:  HCAP vs aspiration pneumonia  -As discussed above, upon admission CXR demonstrated RML consolidation>> more likely aspiration given his history  -Patient was started on empiric antibiotics with length and Zosyn -Blood culture were obtained remain NGTD -He was also placed on DuoNeb with flutter valve, and Mucinex  -quantiferon TB test was done and came back negative, and airborne isolation was DC'd.  -He has defervesced and is clinically much  improved  -antibiotics were deescalated, will change zosyn to Augmentin beginning in a.m. and DC vancomycin  -Pulmonology was consulted and followed patient and suspected might have R. objective sleep apnea and recommended a polysomnogram in the future. -Also pulmonary recommends for patient to followup repeat x-rays in 3-4 weeks to ensure clearing of the right middle lobe  infiltrate as postobstructive lesion will need to be considered if not clearing. -He is to followup with PCP(set up per CM) and also with pulmonology Alcohol withdrawal/delirium tremens.  -As discussed above presented in DTs from Behavioral health(06/16/2013) -intoxicated, developing signs and symptoms of alcohol withdrawal over the last 24 hours which has progressively worsened and was admitted to step down -He was placed on alcohol withdrawal protocol using CIWA scale and thiamine, folate with IV fluid resuscitation.  -Psychiatry was consulted and  Dr. Lolly Mustache that patient initially and stated he has capacity and wants to go to [facility] for treatment when medically cleared.  -After patient improved clinically psychiatry was reconsulted as patient stated he did not want to go for inpatient treatment anymore. Dr. are seen that patient today and from his standpoint recommend that his cocaine for discharge and that his cocaine and willing to go for long-term rehabilitation and to followup with his outpatient psychiatrist and [place]. Social worker has given patient instructions for followup. Hyperammonemia.  -Admission ammonia level= 82umol/L; 2/3 ammonia level=43 umol/L.  -Continue lactulose therapy.  -clinically improved, Continue lactulose  Elevated transaminases.  - Likely secondary to alcohol abuse. Trending down  -Monitor closely  HTN  Patient was placed on metoprolol and lisinopril and is to continue these upon discharge and follow up with PCP Substance abuse  -As above patient was counseled to quit alcohol, and he was placed on see what protocol in the hospital. His to followup for a rehabilitation program following discharge. Hemoptysis  -More likely due to #1, continue antibiotics as above  -TB QuantiFERON was negative, low clinical suspicion for TB, his pneumonia is more likely aspiration related  -continue Protonix to 40 mg BID  -His hemoglobin remained stable he's not had any further  hemoptysis. His to follow up outpatient. Depression  -Continue Zoloft 50 mg daily      Procedures:  none  Consultations:  Psychiatrist  Discharge Exam: Filed Vitals:   06/26/13 0424  BP: 150/94  Pulse: 63  Temp: 98.5 F (36.9 C)  Resp: 18   Exam:  General: A./O to person and time, NAD, decreased tremors  Cardiovascular: Regular rhythm and rate, negative murmurs rubs gallops  Respiratory: Clear to auscultation bilateral  Abdomen: Soft, nontender, nondistended, plus bowel sounds  Extremities: Negative pedal edema, no cyanosis     Discharge Instructions  Discharge Orders   Future Orders Complete By Expires   Diet - low sodium heart healthy  As directed    Increase activity slowly  As directed        Medication List         amoxicillin-clavulanate 875-125 MG per tablet  Commonly known as:  AUGMENTIN  Take 1 tablet by mouth every 12 (twelve) hours.     dextromethorphan-guaiFENesin 30-600 MG per 12 hr tablet  Commonly known as:  MUCINEX DM  Take 1 tablet by mouth 2 (two) times daily.     folic acid 1 MG tablet  Commonly known as:  FOLVITE  Take 1 tablet (1 mg total) by mouth daily.     gabapentin 300 MG capsule  Commonly known as:  NEURONTIN  Take 300-600  mg by mouth 3 (three) times daily. 300 mg every morning, 300 mg at midday, and 600 mg at bedtime.     lactulose 10 GM/15ML solution  Commonly known as:  CHRONULAC  Take 30 mLs (20 g total) by mouth daily.     lisinopril 5 MG tablet  Commonly known as:  PRINIVIL,ZESTRIL  Take 1 tablet (5 mg total) by mouth daily.     LORazepam 1 MG tablet  Commonly known as:  ATIVAN  Take 1 tab 2x/day for 2days, then 1tab daily for day 2days then stop.     metoprolol tartrate 12.5 mg Tabs tablet  Commonly known as:  LOPRESSOR  Take 0.5 tablets (12.5 mg total) by mouth 2 (two) times daily.     multivitamin with minerals Tabs tablet  Take 1 tablet by mouth daily.     pantoprazole 40 MG tablet  Commonly known  as:  PROTONIX  Take 1 tablet (40 mg total) by mouth 2 (two) times daily with a meal.     sertraline 50 MG tablet  Commonly known as:  ZOLOFT  Take 50 mg by mouth daily.     thiamine 100 MG tablet  Take 1 tablet (100 mg total) by mouth daily.       Allergies  Allergen Reactions  . Trazodone And Nefazodone     Priapism.        Follow-up Information   Follow up with [facility] On 07/01/2013. (Arrive by 8am with ID (must have [facility] address), 14 day medication supply, and clothing for screening and possible admission into facility. If you must reschedule screening please call [facility] as soon as possible to do so. )    Contact information:   [address] [place], [place] [address] Phone: [phone] Fax: [phone]      Follow up with [facility]. (Please call [facility] when you discharge from the hospital to schedule hospital follow-up if not already scheduled. )    Contact information:   [address] [place], [place] [address] Phone: [phone] Fax: [phone]      Please follow up. (Psyhiatrist as directed, call for appt upon discharge)        The results of significant diagnostics from this hospitalization (including imaging, microbiology, ancillary and laboratory) are listed below for reference.    Significant Diagnostic Studies: Dg Chest 2 View  06/21/2013   CLINICAL DATA:  Alcohol intoxication  EXAM: CHEST  2 VIEW  COMPARISON:  None.  FINDINGS: Mild cardiac enlargement. Vascular pattern normal. There is consolidation in the right middle lobe. Left lung is clear. No pleural effusions.  IMPRESSION: Right middle lobe consolidation.  This could represent pneumonitis.   Electronically Signed   By: Esperanza Heir M.D.   On: 06/21/2013 20:56   Dg Chest Port 1 View  06/23/2013   CLINICAL DATA:  Pneumonia  EXAM: PORTABLE CHEST - 1 VIEW  COMPARISON:  06/21/2013  FINDINGS: The cardiac shadow is stable. The lungs are well aerated bilaterally. Mild persistent changes  are noted in the right middle lobe. No new focal infiltrate is seen. No acute bony abnormality is noted.  IMPRESSION: No significant change from the prior exam   Electronically Signed   By: Alcide Clever M.D.   On: 06/23/2013 07:39    Microbiology: Recent Results (from the past 240 hour(s))  MRSA PCR SCREENING     Status: None   Collection Time    06/22/13  1:35 AM      Result  Value Range Status   MRSA by PCR NEGATIVE  NEGATIVE Final   Comment:            The GeneXpert MRSA Assay (FDA     approved for NASAL specimens     only), is one component of a     comprehensive MRSA colonization     surveillance program. It is not     intended to diagnose MRSA     infection nor to guide or     monitor treatment for     MRSA infections.  CULTURE, BLOOD (ROUTINE X 2)     Status: None   Collection Time    06/22/13  2:15 AM      Result Value Range Status   Specimen Description BLOOD LEFT HAND   Final   Special Requests BOTTLES DRAWN AEROBIC AND ANAEROBIC [facility]   Final   Culture  Setup Time     Final   Value: 06/22/2013 08:35     Performed at [facility]   Culture     Final   Value:        BLOOD CULTURE RECEIVED NO GROWTH TO DATE CULTURE WILL BE HELD FOR 5 DAYS BEFORE ISSUING A FINAL NEGATIVE REPORT     Performed at [facility]   Report Status PENDING   Incomplete  CULTURE, BLOOD (ROUTINE X 2)     Status: None   Collection Time    06/22/13  2:20 AM      Result Value Range Status   Specimen Description BLOOD LEFT ARM   Final   Special Requests BOTTLES DRAWN AEROBIC AND ANAEROBIC 6CC   Final   Culture  Setup Time     Final   Value: 06/22/2013 08:35     Performed at [facility]   Culture     Final   Value:        BLOOD CULTURE RECEIVED NO GROWTH TO DATE CULTURE WILL BE HELD FOR 5 DAYS BEFORE ISSUING A FINAL NEGATIVE REPORT     Performed at [facility]   Report Status PENDING   Incomplete  CULTURE, EXPECTORATED SPUTUM-ASSESSMENT     Status: None    Collection Time    06/25/13 10:22 AM      Result Value Range Status   Specimen Description SPUTUM   Final   Special Requests Normal   Final   Sputum evaluation     Final   Value: THIS SPECIMEN IS ACCEPTABLE. RESPIRATORY CULTURE REPORT TO FOLLOW.   Report Status 06/25/2013 FINAL   Final  CULTURE, RESPIRATORY (NON-EXPECTORATED)     Status: None   Collection Time    06/25/13 10:22 AM      Result Value Range Status   Specimen Description SPUTUM   Final   Special Requests NONE   Final   Gram Stain     Final   Value: NO WBC SEEN     NO SQUAMOUS EPITHELIAL CELLS SEEN     NO ORGANISMS SEEN     Performed at [facility]   Culture     Final   Value: Culture reincubated for better growth     Performed at [facility]   Report Status PENDING   Incomplete     Labs: Basic Metabolic Panel:  Recent Labs Lab 06/22/13 0215 06/23/13 0353 06/24/13 0330 06/25/13 0318 06/26/13 0326  NA 136* 135* 132* 133* 136*  K 4.1 3.9 3.5* 4.7 4.0  CL 104 103 100  102 104  CO2 22 20 20 21 22   GLUCOSE 83 96 120* 138* 104*  BUN 10 9 8 6 8   CREATININE 0.57 0.60 0.56 0.54 0.73  CALCIUM 8.1* 7.8* 7.6* 8.0* 8.1*  MG  --  1.7  --   --   --   PHOS  --  2.4  --   --   --    Liver Function Tests:  Recent Labs Lab 06/21/13 2120 06/22/13 0215 06/24/13 0330 06/25/13 0318 06/26/13 0326  AST 142* 134* 168* 155* 118*  ALT 95* 89* 115* 107* 93*  ALKPHOS 115 106 110 101 103  BILITOT 2.6* 2.4* 2.2* 2.1* 1.6*  PROT 8.2 7.5 7.0 7.6 6.9  ALBUMIN 3.4* 3.1* 2.8* 2.9* 2.7*   No results found for this basename: LIPASE, AMYLASE,  in the last 168 hours  Recent Labs Lab 06/21/13 0411 06/21/13 1949 06/21/13 2033 06/22/13 1045  AMMONIA 82* RESULTS UNAVAILABLE DUE TO INTERFERING SUBSTANCE 40 43   CBC:  Recent Labs Lab 06/21/13 2120 06/22/13 0215 06/23/13 0353 06/24/13 0330 06/25/13 0530 06/26/13 0326  WBC 3.1* 3.0* 2.8* 4.4 3.8* 3.3*  NEUTROABS 1.5* 1.4*  --  2.5 1.7 1.5*  HGB 13.0  12.7* 13.7 12.9* 12.9* 12.3*  HCT 38.8* 38.4* 39.9 37.9* 38.4* 37.0*  MCV 104.3* 105.2* 103.6* 101.9* 102.4* 102.2*  PLT 59* 50* 63* 67* 74* 94*   Cardiac Enzymes: No results found for this basename: CKTOTAL, CKMB, CKMBINDEX, TROPONINI,  in the last 168 hours BNP: BNP (last 3 results) No results found for this basename: PROBNP,  in the last 8760 hours CBG:  Recent Labs Lab 06/21/13 2235 06/22/13 0154  GLUCAP 98 84       Signed:  Teresita Fanton C  [facility] 06/26/2013, 3:54 PM

## 2013-06-26 NOTE — Progress Notes (Signed)
Clinical Social Work  CSW reviewed chart which stated that psych MD recommends that patient follow up on an outpatient basis. CSW met with patient at bedside. Patient alert and oriented and reports that he is ready to DC so he can get home. Patient reports he has called wife and informed her that he is leaving today.  Patient reports he is not feeling well today physically but emotionally is feeling fine. Patient reports he plans to DC today and go see his psychiatrist. CSW explained that since it is late in the day patient would not have time to see psychiatrist today. Patient reports that is fine and that he will make an appointment on Monday if he is unable to see psychiatrist today. Patient reports that his psychiatrist takes walk-in appointments and that his caregiver can take him on Monday for an appointment. Patient is aware of screening appointment on 2/11 at [facility] and reports he is still agreeable to inpatient treatment.   Patient denies any depression today and contracts for safety. Patient continues to report that he is anxious to DC home and does not have any needs from CSW. All follow up information placed on AVS.  CSW will continue to follow.  [place], [place] [phone]

## 2013-06-27 LAB — CULTURE, RESPIRATORY W GRAM STAIN

## 2013-06-27 LAB — CULTURE, RESPIRATORY
CULTURE: NORMAL
Gram Stain: NONE SEEN

## 2013-06-28 LAB — CULTURE, BLOOD (ROUTINE X 2)
CULTURE: NO GROWTH
Culture: NO GROWTH

## 2013-06-29 NOTE — Progress Notes (Signed)
Patient Discharge Instructions:  Next Level Care Provider Has Access to the EMR, 06/29/13 Patient was transferred to [facility].  The entire medical record is made available via CHL/Epic access.  Jerelene ReddenSheena E [place], 06/29/2013, 2:55 PM
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
## 2013-09-10 ENCOUNTER — Encounter (HOSPITAL_COMMUNITY): Payer: Self-pay | Admitting: Emergency Medicine

## 2013-09-10 ENCOUNTER — Inpatient Hospital Stay (HOSPITAL_COMMUNITY)
Admission: EM | Admit: 2013-09-10 | Discharge: 2013-09-15 | DRG: 897 | Disposition: A | Discharge status: Home / Self Care | Payer: Self-pay | Attending: Internal Medicine | Admitting: Internal Medicine

## 2013-09-10 ENCOUNTER — Emergency Department (HOSPITAL_COMMUNITY): Payer: Self-pay

## 2013-09-10 ENCOUNTER — Inpatient Hospital Stay (HOSPITAL_COMMUNITY): Payer: Self-pay

## 2013-09-10 DIAGNOSIS — F10239 Alcohol dependence with withdrawal, unspecified: Secondary | ICD-10-CM | POA: Diagnosis present

## 2013-09-10 DIAGNOSIS — H5316 Psychophysical visual disturbances: Secondary | ICD-10-CM | POA: Diagnosis present

## 2013-09-10 DIAGNOSIS — F10939 Alcohol use, unspecified with withdrawal, unspecified: Secondary | ICD-10-CM | POA: Diagnosis present

## 2013-09-10 DIAGNOSIS — F32A Depression, unspecified: Secondary | ICD-10-CM

## 2013-09-10 DIAGNOSIS — F10931 Alcohol use, unspecified with withdrawal delirium: Principal | ICD-10-CM | POA: Diagnosis present

## 2013-09-10 DIAGNOSIS — F3289 Other specified depressive episodes: Secondary | ICD-10-CM | POA: Diagnosis present

## 2013-09-10 DIAGNOSIS — R748 Abnormal levels of other serum enzymes: Secondary | ICD-10-CM | POA: Diagnosis present

## 2013-09-10 DIAGNOSIS — Z8673 Personal history of transient ischemic attack (TIA), and cerebral infarction without residual deficits: Secondary | ICD-10-CM

## 2013-09-10 DIAGNOSIS — R740 Nonspecific elevation of levels of transaminase and lactic acid dehydrogenase [LDH]: Secondary | ICD-10-CM

## 2013-09-10 DIAGNOSIS — J189 Pneumonia, unspecified organism: Secondary | ICD-10-CM

## 2013-09-10 DIAGNOSIS — D72819 Decreased white blood cell count, unspecified: Secondary | ICD-10-CM | POA: Diagnosis present

## 2013-09-10 DIAGNOSIS — R7401 Elevation of levels of liver transaminase levels: Secondary | ICD-10-CM

## 2013-09-10 DIAGNOSIS — F1910 Other psychoactive substance abuse, uncomplicated: Secondary | ICD-10-CM

## 2013-09-10 DIAGNOSIS — I252 Old myocardial infarction: Secondary | ICD-10-CM

## 2013-09-10 DIAGNOSIS — F10231 Alcohol dependence with withdrawal delirium: Principal | ICD-10-CM | POA: Diagnosis present

## 2013-09-10 DIAGNOSIS — B1920 Unspecified viral hepatitis C without hepatic coma: Secondary | ICD-10-CM | POA: Diagnosis present

## 2013-09-10 DIAGNOSIS — F17200 Nicotine dependence, unspecified, uncomplicated: Secondary | ICD-10-CM | POA: Diagnosis present

## 2013-09-10 DIAGNOSIS — E871 Hypo-osmolality and hyponatremia: Secondary | ICD-10-CM

## 2013-09-10 DIAGNOSIS — F329 Major depressive disorder, single episode, unspecified: Secondary | ICD-10-CM | POA: Diagnosis present

## 2013-09-10 DIAGNOSIS — I10 Essential (primary) hypertension: Secondary | ICD-10-CM | POA: Diagnosis present

## 2013-09-10 DIAGNOSIS — D696 Thrombocytopenia, unspecified: Secondary | ICD-10-CM | POA: Diagnosis present

## 2013-09-10 DIAGNOSIS — Z781 Physical restraint status: Secondary | ICD-10-CM | POA: Diagnosis present

## 2013-09-10 DIAGNOSIS — E876 Hypokalemia: Secondary | ICD-10-CM | POA: Diagnosis present

## 2013-09-10 DIAGNOSIS — Z888 Allergy status to other drugs, medicaments and biological substances status: Secondary | ICD-10-CM

## 2013-09-10 DIAGNOSIS — E236 Other disorders of pituitary gland: Secondary | ICD-10-CM | POA: Diagnosis present

## 2013-09-10 DIAGNOSIS — G9340 Encephalopathy, unspecified: Secondary | ICD-10-CM

## 2013-09-10 DIAGNOSIS — F1020 Alcohol dependence, uncomplicated: Secondary | ICD-10-CM | POA: Diagnosis present

## 2013-09-10 LAB — COMPREHENSIVE METABOLIC PANEL
ALK PHOS: 138 U/L — AB (ref 39–117)
ALT: 121 U/L — ABNORMAL HIGH (ref 0–53)
AST: 200 U/L — ABNORMAL HIGH (ref 0–37)
Albumin: 3.5 g/dL (ref 3.5–5.2)
BUN: 13 mg/dL (ref 6–23)
CO2: 20 mEq/L (ref 19–32)
Calcium: 9.2 mg/dL (ref 8.4–10.5)
Chloride: 100 mEq/L (ref 96–112)
Creatinine, Ser: 0.6 mg/dL (ref 0.50–1.35)
GFR calc non Af Amer: >90 mL/min (ref >90)
Glucose, Bld: 97 mg/dL (ref 70–99)
POTASSIUM: 3.6 meq/L — AB (ref 3.7–5.3)
SODIUM: 135 meq/L — AB (ref 137–147)
TOTAL PROTEIN: 8.6 g/dL — AB (ref 6.0–8.3)
Total Bilirubin: 3.2 mg/dL — ABNORMAL HIGH (ref 0.3–1.2)

## 2013-09-10 LAB — RAPID URINE DRUG SCREEN, HOSP PERFORMED
Amphetamines: NOT DETECTED
Barbiturates: NOT DETECTED
Benzodiazepines: NOT DETECTED
Cocaine: NOT DETECTED
Opiates: NOT DETECTED
Tetrahydrocannabinol: NOT DETECTED

## 2013-09-10 LAB — LIPASE, BLOOD: Lipase: 48 U/L (ref 11–59)

## 2013-09-10 LAB — URINALYSIS, ROUTINE W REFLEX MICROSCOPIC
Glucose, UA: NEGATIVE mg/dL
Hgb urine dipstick: NEGATIVE
KETONES UR: NEGATIVE mg/dL
Leukocytes, UA: NEGATIVE
Nitrite: NEGATIVE
Protein, ur: NEGATIVE mg/dL
Specific Gravity, Urine: 1.022 (ref 1.005–1.030)
UROBILINOGEN UA: 2 mg/dL — AB (ref 0.0–1.0)
pH: 6 (ref 5.0–8.0)

## 2013-09-10 LAB — I-STAT CHEM 8, ED
BUN: 13 mg/dL (ref 6–23)
CREATININE: 0.7 mg/dL (ref 0.50–1.35)
Calcium, Ion: 1.17 mmol/L (ref 1.12–1.23)
Chloride: 103 mEq/L (ref 96–112)
GLUCOSE: 88 mg/dL (ref 70–99)
HCT: 46 % (ref 39.0–52.0)
HEMOGLOBIN: 15.6 g/dL (ref 13.0–17.0)
Potassium: 3.9 mEq/L (ref 3.7–5.3)
SODIUM: 139 meq/L (ref 137–147)
TCO2: 21 mmol/L (ref 0–100)

## 2013-09-10 LAB — CBC WITH DIFFERENTIAL/PLATELET
Basophils Absolute: 0.1 10*3/uL (ref 0.0–0.1)
Basophils Relative: 2 % — ABNORMAL HIGH (ref 0–1)
EOS ABS: 0.1 10*3/uL (ref 0.0–0.7)
Eosinophils Relative: 3 % (ref 0–5)
HCT: 40.9 % (ref 39.0–52.0)
HEMOGLOBIN: 14.3 g/dL (ref 13.0–17.0)
LYMPHS ABS: 1 10*3/uL (ref 0.7–4.0)
Lymphocytes Relative: 25 % (ref 12–46)
MCH: 36.2 pg — AB (ref 26.0–34.0)
MCHC: 35 g/dL (ref 30.0–36.0)
MCV: 103.5 fL — ABNORMAL HIGH (ref 78.0–100.0)
MONOS PCT: 14 % — AB (ref 3–12)
Monocytes Absolute: 0.6 10*3/uL (ref 0.1–1.0)
NEUTROS PCT: 57 % (ref 43–77)
Neutro Abs: 2.3 10*3/uL (ref 1.7–7.7)
Platelets: 56 10*3/uL — ABNORMAL LOW (ref 150–400)
RBC: 3.95 MIL/uL — AB (ref 4.22–5.81)
RDW: 14.3 % (ref 11.5–15.5)
WBC: 4 10*3/uL (ref 4.0–10.5)

## 2013-09-10 LAB — ETHANOL: Alcohol, Ethyl (B): <11 mg/dL (ref 0–11)

## 2013-09-10 LAB — MRSA PCR SCREENING: MRSA by PCR: NEGATIVE

## 2013-09-10 MED ORDER — THIAMINE HCL 100 MG/ML IJ SOLN
100.0000 mg | Freq: Every day | INTRAMUSCULAR | Status: DC
Start: 2013-09-10 — End: 2013-09-10

## 2013-09-10 MED ORDER — DEXMEDETOMIDINE HCL IN NACL 200 MCG/50ML IV SOLN
0.2000 ug/kg/h | INTRAVENOUS | Status: AC
  Administered 2013-09-10: 0.9 ug/kg/h via INTRAVENOUS
  Administered 2013-09-11: 0.2 ug/kg/h via INTRAVENOUS
  Administered 2013-09-11: 0.7 ug/kg/h via INTRAVENOUS
  Filled 2013-09-10 (×4): qty 50

## 2013-09-10 MED ORDER — LORAZEPAM 2 MG/ML IJ SOLN
2.0000 mg | Freq: Once | INTRAMUSCULAR | Status: AC
  Administered 2013-09-10: 2 mg via INTRAVENOUS

## 2013-09-10 MED ORDER — HALOPERIDOL LACTATE 5 MG/ML IJ SOLN
INTRAMUSCULAR | Status: AC
  Administered 2013-09-10: 5 mg
  Filled 2013-09-10: qty 1

## 2013-09-10 MED ORDER — VITAMIN B-1 100 MG PO TABS
100.0000 mg | ORAL_TABLET | Freq: Every day | ORAL | Status: DC
  Administered 2013-09-10: 100 mg via ORAL
  Filled 2013-09-10 (×2): qty 1

## 2013-09-10 MED ORDER — LORAZEPAM 2 MG/ML IJ SOLN
2.0000 mg | Freq: Once | INTRAMUSCULAR | Status: AC
  Administered 2013-09-10: 2 mg via INTRAVENOUS
  Filled 2013-09-10: qty 1

## 2013-09-10 MED ORDER — NICOTINE 14 MG/24HR TD PT24
14.0000 mg | MEDICATED_PATCH | Freq: Every day | TRANSDERMAL | Status: DC
  Administered 2013-09-10 – 2013-09-15 (×6): 14 mg via TRANSDERMAL
  Filled 2013-09-10 (×7): qty 1

## 2013-09-10 MED ORDER — LORAZEPAM 2 MG/ML IJ SOLN
2.0000 mg | INTRAMUSCULAR | Status: DC | PRN
  Administered 2013-09-10 (×3): 3 mg via INTRAVENOUS
  Administered 2013-09-10 – 2013-09-12 (×8): 2 mg via INTRAVENOUS
  Administered 2013-09-12: 3 mg via INTRAVENOUS
  Administered 2013-09-13 – 2013-09-14 (×3): 2 mg via INTRAVENOUS
  Filled 2013-09-10: qty 1
  Filled 2013-09-10: qty 2
  Filled 2013-09-10 (×3): qty 1
  Filled 2013-09-10 (×2): qty 2
  Filled 2013-09-10 (×2): qty 1
  Filled 2013-09-10: qty 2
  Filled 2013-09-10 (×5): qty 1

## 2013-09-10 MED ORDER — HALOPERIDOL LACTATE 5 MG/ML IJ SOLN: 5.0000 mg | Freq: Once | INTRAMUSCULAR | Status: DC

## 2013-09-10 MED ORDER — LORAZEPAM 2 MG/ML IJ SOLN
2.0000 mg | INTRAMUSCULAR | Status: DC
  Administered 2013-09-10 (×4): 2 mg via INTRAVENOUS
  Filled 2013-09-10 (×4): qty 1

## 2013-09-10 MED ORDER — HALOPERIDOL LACTATE 5 MG/ML IJ SOLN: 1.0000 mg | Freq: Once | INTRAMUSCULAR | Status: DC

## 2013-09-10 MED ORDER — SODIUM CHLORIDE 0.9 % IJ SOLN
3.0000 mL | Freq: Two times a day (BID) | INTRAMUSCULAR | Status: DC
  Administered 2013-09-10 – 2013-09-13 (×6): 3 mL via INTRAVENOUS
  Administered 2013-09-13: 11:00:00 via INTRAVENOUS
  Administered 2013-09-15: 3 mL via INTRAVENOUS

## 2013-09-10 MED ORDER — LORAZEPAM 2 MG/ML IJ SOLN: 5.0000 mg | Freq: Once | INTRAMUSCULAR | Status: AC

## 2013-09-10 MED ORDER — LORAZEPAM 1 MG PO TABS: 0.0000 mg | ORAL_TABLET | Freq: Two times a day (BID) | ORAL | Status: DC

## 2013-09-10 MED ORDER — HALOPERIDOL LACTATE 5 MG/ML IJ SOLN
1.0000 mg | Freq: Once | INTRAMUSCULAR | Status: AC
  Administered 2013-09-10: 1 mg via INTRAMUSCULAR
  Filled 2013-09-10: qty 1

## 2013-09-10 MED ORDER — FOLIC ACID 1 MG PO TABS
1.0000 mg | ORAL_TABLET | Freq: Every day | ORAL | Status: DC
  Administered 2013-09-10: 1 mg via ORAL
  Filled 2013-09-10 (×2): qty 1

## 2013-09-10 MED ORDER — LORAZEPAM 2 MG/ML IJ SOLN
0.0000 mg | Freq: Four times a day (QID) | INTRAMUSCULAR | Status: DC
  Administered 2013-09-10: 2 mg via INTRAVENOUS
  Filled 2013-09-10: qty 1

## 2013-09-10 MED ORDER — LORAZEPAM 1 MG PO TABS: 0.0000 mg | ORAL_TABLET | Freq: Four times a day (QID) | ORAL | Status: DC

## 2013-09-10 MED ORDER — LORAZEPAM 2 MG/ML IJ SOLN
0.0000 mg | Freq: Two times a day (BID) | INTRAMUSCULAR | Status: DC
  Filled 2013-09-10: qty 1
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
## 2013-09-10 NOTE — ED Notes (Signed)
Pt transported to CT with RN.  Ativan given enroute per Dr Dierdre Highmanpitz.

## 2013-09-10 NOTE — ED Notes (Signed)
Dr Dorise HissKollar at bedside.

## 2013-09-10 NOTE — ED Notes (Signed)
Attempted to call report.  RN stated she did not know she was getting a pt and will call me back.

## 2013-09-10 NOTE — ED Notes (Signed)
Attempted to contact [facility] to get more information about pts detox and ETOH abuse history. Automated voice message stated that [facility] does not open until 8:30am.

## 2013-09-10 NOTE — ED Notes (Addendum)
Patient transported to CT 

## 2013-09-10 NOTE — Progress Notes (Signed)
09/10/2013 Patient came from the emergency  Room to 2central at 1015. He is alert, and orient to person, on and off he is aware of where he is. He is not sure of the the month and year at times. He came in with alcohol withdrawal and is on a CIWA protocol. Patient have little red rash on skin and bruises on arms. He is a fall risk and is unstable on feet. He came up with a sitter from the Emergency room for safety. [facility] Ramiz Turpin RN.

## 2013-09-10 NOTE — Progress Notes (Signed)
Utilization review completed. Arinze Rivadeneira, RN, BSN. 

## 2013-09-10 NOTE — ED Notes (Signed)
EDP Opitz at the bedside.

## 2013-09-10 NOTE — Progress Notes (Addendum)
[facility] PCCM PROGRESS NOTE  S: [facility]. Patient had reportedly been receiving large doses of ativan for treatment of ETOH withdrawal and remained confused, agitated, and was trying to leave the unit and was growing combative and threatening with staff when they attempted to stop him. Patient states that he needs to break out of this building so that he can deliver 3 cars to [place] and to help his family members move. The cars he was referring to have not been on the market for some time. He also did not know what building he was in. He would not answer questions appropriately.   Ceasar Mons:  Filed Vitals:   09/10/13 1613 09/10/13 1756 09/10/13 2038 09/10/13 2340  BP: 140/103 154/95  144/97  Pulse: 99 91  86  Temp: 98 F (36.7 C)  97.9 F (36.6 C)   TempSrc: Oral  Oral   Resp: 16   17  Height:    5\' 10"  (1.778 m)  Weight:    75.796 kg (167 lb 1.6 oz)  SpO2: 99%   95%   PHYSICAL EXAMINATION:  General: Disheveled male, pacing in room and hallway. Agitated, anxious.  Neuro: Alert, oriented to self only.   HEENT: [place]/AT. PERRL Cardiovascular: RRR, no M/R/G.  Lungs: Respirations even and unlabored. CTA bilaterally, No W/R/R.  Abdomen: BS x 4, soft, NT/ND.  Musculoskeletal: No gross deformities, no edema.  Skin: Intact, warm, mild faint truncal rash. Was reported at time of admission as well.     A/P:  Alcohol Withdrawal: Patient is confused, disoriented, and extremely agitated. He has reportedly been having some hallucinations per internal medicine residents.  Doubt intracranial process Doubt meningitis in absence of fevers and leukocytosis  RECS: - Transfer to ICU and begin Precedex infusion - Ativan per CIWA protocol - Soft wrist restraints. - Consider CT head  - Doubt LP would be of assistance  Joneen RoachPaul Hoffman, ACNP   PCCM Attending Note:  I have evaluated the patient, discussed the case with Henreitta LeberP Hoffman, NP, and bedside RN. At this time (06:00) he is more comfortable,  sleeping but is protecting his airway. When he does wake up he remains very delirious. Ativan is being given 2mg  consistently with his CIWA scoring q2h. Suspect will need to convert to scheduled regimen this am, initially IV with goal transition to PO. Continue current precedex for now to facilitate. No evidence of impending resp failure at this time, will continue to watch closely.   CC time 45 minutes  Levy Pupaobert Byrum, MD, PhD 09/11/2013, 6:14 AM [facility] [phone] or if no answer [phone]

## 2013-09-10 NOTE — ED Notes (Signed)
Pt stating he wants to smoke a cigarette.  Dr Kem KaysPaya paged.
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
## 2013-09-10 NOTE — ED Notes (Signed)
Contacted CT about delay.

## 2013-09-10 NOTE — ED Notes (Signed)
Pt reports nausea, body aches, arm and leg pain, gait disturbance, dizzy, and hit head and fallen several times, [facility] pt detoxing from etoh last drink was 4/18, still has shaking today, pt was given ativan 0200. Reports nightmares.

## 2013-09-11 ENCOUNTER — Inpatient Hospital Stay (HOSPITAL_COMMUNITY): Payer: Self-pay

## 2013-09-11 DIAGNOSIS — G9340 Encephalopathy, unspecified: Secondary | ICD-10-CM

## 2013-09-11 DIAGNOSIS — F10239 Alcohol dependence with withdrawal, unspecified: Secondary | ICD-10-CM

## 2013-09-11 DIAGNOSIS — F10939 Alcohol use, unspecified with withdrawal, unspecified: Secondary | ICD-10-CM

## 2013-09-11 LAB — POCT I-STAT 3, ART BLOOD GAS (G3+)
Acid-base deficit: 1 mmol/L (ref 0.0–2.0)
Bicarbonate: 24.5 mEq/L — ABNORMAL HIGH (ref 20.0–24.0)
O2 Saturation: 92 %
PO2 ART: 67 mmHg — AB (ref 80.0–100.0)
TCO2: 26 mmol/L (ref 0–100)
pCO2 arterial: 42.6 mmHg (ref 35.0–45.0)
pH, Arterial: 7.369 (ref 7.350–7.450)

## 2013-09-11 LAB — BASIC METABOLIC PANEL
BUN: 10 mg/dL (ref 6–23)
CO2: 22 mEq/L (ref 19–32)
CREATININE: 0.51 mg/dL (ref 0.50–1.35)
Calcium: 9 mg/dL (ref 8.4–10.5)
Chloride: 105 mEq/L (ref 96–112)
GFR calc Af Amer: >90 mL/min (ref >90)
Glucose, Bld: 114 mg/dL — ABNORMAL HIGH (ref 70–99)
POTASSIUM: 4.1 meq/L (ref 3.7–5.3)
Sodium: 139 mEq/L (ref 137–147)

## 2013-09-11 LAB — GLUCOSE, CAPILLARY
GLUCOSE-CAPILLARY: 100 mg/dL — AB (ref 70–99)
GLUCOSE-CAPILLARY: 101 mg/dL — AB (ref 70–99)
Glucose-Capillary: 135 mg/dL — ABNORMAL HIGH (ref 70–99)
Glucose-Capillary: 100 mg/dL — ABNORMAL HIGH (ref 70–99)
Glucose-Capillary: 104 mg/dL — ABNORMAL HIGH (ref 70–99)

## 2013-09-11 LAB — CBC
HCT: 40.1 % (ref 39.0–52.0)
HEMOGLOBIN: 13.9 g/dL (ref 13.0–17.0)
MCH: 35.6 pg — ABNORMAL HIGH (ref 26.0–34.0)
MCHC: 34.7 g/dL (ref 30.0–36.0)
MCV: 102.8 fL — ABNORMAL HIGH (ref 78.0–100.0)
Platelets: 58 10*3/uL — ABNORMAL LOW (ref 150–400)
RBC: 3.9 MIL/uL — ABNORMAL LOW (ref 4.22–5.81)
RDW: 14.5 % (ref 11.5–15.5)
WBC: 2.6 10*3/uL — ABNORMAL LOW (ref 4.0–10.5)

## 2013-09-11 MED ORDER — SODIUM CHLORIDE 0.9 % IV SOLN
INTRAVENOUS | Status: DC
  Administered 2013-09-11: 21:00:00 via INTRAVENOUS

## 2013-09-11 MED ORDER — LORAZEPAM 2 MG/ML IJ SOLN
1.0000 mg | Freq: Four times a day (QID) | INTRAMUSCULAR | Status: DC
  Administered 2013-09-11 – 2013-09-12 (×6): 1 mg via INTRAVENOUS
  Administered 2013-09-13: 12:00:00 via INTRAVENOUS
  Administered 2013-09-13 – 2013-09-14 (×4): 1 mg via INTRAVENOUS
  Filled 2013-09-11 (×10): qty 1

## 2013-09-11 MED ORDER — SODIUM CHLORIDE 0.9 % IJ SOLN
10.0000 mL | Freq: Two times a day (BID) | INTRAMUSCULAR | Status: DC
  Administered 2013-09-11 – 2013-09-14 (×6): 10 mL
  Administered 2013-09-14: 20 mL
  Administered 2013-09-15: 10 mL

## 2013-09-11 MED ORDER — FOLIC ACID 5 MG/ML IJ SOLN
1.0000 mg | Freq: Every day | INTRAMUSCULAR | Status: DC
  Administered 2013-09-11 – 2013-09-14 (×4): 1 mg via INTRAVENOUS
  Filled 2013-09-11 (×4): qty 0.2

## 2013-09-11 MED ORDER — SODIUM CHLORIDE 0.9 % IJ SOLN
10.0000 mL | INTRAMUSCULAR | Status: DC | PRN
  Administered 2013-09-14: 20 mL

## 2013-09-11 MED ORDER — THIAMINE HCL 100 MG/ML IJ SOLN
100.0000 mg | Freq: Every day | INTRAMUSCULAR | Status: DC
  Administered 2013-09-11 – 2013-09-14 (×4): 100 mg via INTRAVENOUS
  Filled 2013-09-11 (×4): qty 1
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
## 2013-09-11 NOTE — Progress Notes (Signed)
Paged teaching services. Pt is growing increasingly confused and combative. Pt is unsteady on his feet and is refusing to stay in the bed nor the room.  Pt was given 5mg  of ativan at 2258. Security was called and [place] from rapid response was on hand to assist.

## 2013-09-11 NOTE — Progress Notes (Signed)
While on rounds, bedside RN requested assistance with patient. Patient in active withdrawal from ETOH. Patient is receiving 4-5mg  ativan per hour and continues to have tremors, confusion, and hallucinations. Patient has also received 5mg  IV haldol during shift. Reorientation attempted several times, patient is only oriented to self and his birthday. Safety sitter is at the bedside. Patient is extremely agitated and requesting a cigarette. Nicotine patch was applied by dayshift RN. Attending MDs and security requested to the bedside. Patient would not stay in ordered medical restraints or in bed and was assisted with ambulation throughout the halls of the floor. Patient is extremely unsteady and refused to sit in a chair or bed. CCM consulted again for the potential need of a Precedex drip. Vital signs were unable to be obtained during event due to agitation. CCM at bedside, orders received for transfer to ICU and precedex drip. Assisted with transport to 2M02.

## 2013-09-11 NOTE — Progress Notes (Signed)
piccPeripherally Inserted Central Catheter/Midline Placement  The IV Nurse has discussed with the patient and/or persons authorized to consent for the patient, the purpose of this procedure and the potential benefits and risks involved with this procedure.  The benefits include less needle sticks, lab draws from the catheter and patient may be discharged home with the catheter.  Risks include, but not limited to, infection, bleeding, blood clot (thrombus formation), and puncture of an artery; nerve damage and irregular heat beat.  Alternatives to this procedure were also discussed.  PICC/Midline Placement Documentation    Phone consent from wife    Ronnie Mays 09/11/2013, 1:28 PM

## 2013-09-11 NOTE — Progress Notes (Signed)
PULMONARY / CRITICAL CARE MEDICINE   Name: Ronnie OrtWally Dobies MRN: [id number] DOB: [birth year]    ADMISSION DATE:  09/10/2013 CONSULTATION DATE:  4/23/12015  REFERRING MD :  Danise EdgeIMTS, Dr. Kem KaysPaya PRIMARY SERVICE: IMTS  CHIEF COMPLAINT:  Alcohol Withdrawal  BRIEF PATIENT DESCRIPTION: 48 y.o. M presents to ED from alcohol rehab due to being "off balance" for the last several days. Admitted to SDU by IMTS service on CIWA protocol. PCCM consulted for concerns of needing Precedex.  SIGNIFICANT EVENTS / STUDIES:  04/23 EtOH neg 04/23 UDS neg 04/23 CT head w/o contrast - no acute intracranial abnormality  LINES / TUBES: None   CULTURES: None   ANTIBIOTICS: None   INTERVAL HISTORY: Agitated, hallucinating overnight and attempted elopement from SDU.  Required transfer to ICU for precedex gtt.    VITAL SIGNS: Temp:  [97.9 F (36.6 C)-98.5 F (36.9 C)] 98 F (36.7 C) (04/24 0436) Pulse Rate:  [63-99] 63 (04/24 0600) Resp:  [13-21] 16 (04/24 0600) BP: (127-171)/(88-127) 154/93 mmHg (04/24 0600) SpO2:  [93 %-99 %] 96 % (04/24 0600) Weight:  [75.2 kg (165 lb 12.6 oz)-75.796 kg (167 lb 1.6 oz)] 75.796 kg (167 lb 1.6 oz) (04/23 2340)  HEMODYNAMICS:   VENTILATOR SETTINGS: N/A   INTAKE / OUTPUT: Intake/Output     04/23 0701 - 04/24 0700   I.V. (mL/kg) 79.6 (1)   Total Intake(mL/kg) 79.6 (1)   Urine (mL/kg/hr) 600 (0.3)   Total Output 600   Net -520.4       Urine Occurrence 1 x   Stool Occurrence     PHYSICAL EXAMINATION: General:  Sleep comfortably, restrained in posey and soft wrist restraints Neuro:  Unable to assess  HEENT:  WNL Cardiovascular:  RRR Lungs:  CTA B/L Abdomen:  Soft, NT, ND Musculoskeletal:  No abnormality noted Skin:  Intact   LABS:  CBC  Recent Labs Lab 09/10/13 0430 09/10/13 0507 09/11/13 0305  WBC 4.0  --  2.6*  HGB 14.3 15.6 13.9  HCT 40.9 46.0 40.1  PLT 56*  --  58*   BMET  Recent Labs Lab 09/10/13 0430 09/10/13 0507 09/11/13 0305  NA  135* 139 139  K 3.6* 3.9 4.1  CL 100 103 105  CO2 20  --  22  BUN 13 13 10   CREATININE 0.60 0.70 0.51  GLUCOSE 97 88 114*   Electrolytes  Recent Labs Lab 09/10/13 0430 09/11/13 0305  CALCIUM 9.2 9.0   Liver Enzymes  Recent Labs Lab 09/10/13 0430  AST 200*  ALT 121*  ALKPHOS 138*  BILITOT 3.2*  ALBUMIN 3.5   Glucose  Recent Labs Lab 09/11/13 0006 09/11/13 0421  GLUCAP 100* 135*    Imaging X-ray Chest Pa And Lateral   09/10/2013   CLINICAL DATA:  Generalized body pain with agitation. Alcohol withdrawal.  EXAM: CHEST  2 VIEW  COMPARISON:  Chest radiographs 06/23/2013 and 06/21/2013.  FINDINGS: The heart size and mediastinal contours are stable. There is improved aeration of the lung bases with resolution of the previously demonstrated right middle lobe infiltrate. No focal airspace disease is demonstrated. There is mild central airway thickening. There is no pleural effusion or pneumothorax. The osseous structures appear unchanged.  IMPRESSION: Resolved right middle lobe infiltrate with mild generalized central airway thickening. No acute findings demonstrated.   Electronically Signed   By: Roxy HorsemanBill  Veazey M.D.   On: 09/10/2013 14:06   Ct Head Wo Contrast  09/10/2013   CLINICAL DATA:  DIZZINESS GAIT PROBLEM NAUSEA EXTREMITY  PAIN  EXAM: CT HEAD WITHOUT CONTRAST  TECHNIQUE: Contiguous axial images were obtained from the base of the skull through the vertex without intravenous contrast.  COMPARISON:  None.  FINDINGS: No acute intracranial abnormality. Specifically, no hemorrhage, hydrocephalus, mass lesion, chronic focal infarction, or significant intracranial injury. No acute calvarial abnormality. There is mild to moderate global atrophy consistent with patient's history of ETOH abuse. Diffuse mild areas of low attenuation within the subcortical, deep, and periventricular white matter regions consistent with mild small-vessel white matter ischemia. The visualized paranasal sinuses  and mastoid air cells are patent.  IMPRESSION: No acute intracranial abnormality.   Electronically Signed   By: Salome HolmesHector  Cooper M.D.   On: 09/10/2013 07:28    ASSESSMENT / PLAN:  PULMONARY A: No acute abnormality. P:   Monitor for signs of airway compromise Consider abg assessment x 1 pcxr in am for aspiration risk  CARDIOVASCULAR A: HTN, BP elevated overnight (in setting of EtOH w/d), currently normotensive (sleeping); patient is supposed to be taking metoprolol and lisinopril (unsure if he filled these meds) P:  - consider addition of antihypertensive if BP elevated again -tolerated precedex thus far -treating DT's -allow pos balance -if htn add clonidine patch likely unable to tolerate BB  RENAL A:  DT P:   Allow pos balance bmet in am  Lytes in am , mag, phos in am   GASTROINTESTINAL A:  R/o cirrhosis, r/o etoh hepatitis P:   No GI ppx required Consider NGT and start TF if neurostatus not improved  ppi if npo consider US abdo No role steroids Hep panel repeat  HEMATOLOGIC A:  Thrombocytopenia, chronic (baseline 50-60s), currently 58 (HIV, hep panel neg 02/15)       Leukopenia, chronic, 04/23 ANC 2.3       Elevated liver enzymes, chronic - likely 2/2 to EtOH abuse / hepatitis; hepatitis panel neg in 02/15 P:  Monitor CBC with diff VTE ppx - SCDs in the setting of thrombocytopenia  Consider repeat HIV, hep panel For us abdo for dx cirhosis  INFECTIOUS A:  At risk asp P:   pcxr in am   ENDOCRINE A:  At risk hypoglycemia  P:   Add cbg  NEUROLOGIC A:  Alcohol withdrawal - very agitated, eloped from SDU last night and required significant amounts of Ativan so transferred to ICU for Precedex gtt.      Tobacco use disorder       Depression - on Zoloft 50mg  daily P:   - Continue monitoring for seizure, DTs - Precedex gtt -MD driven treatment, for DT, Ativan 1 mg q6h , hold for lethargy - Continue thiamine, folic acid - Continue soft mechanical restraints  and sitter for patient safety - Continue nicotine patch - Restart Zoloft 50mg  -dc haldol, not a good affect thus far -no further ammonia levels -may need lactulose   Evelena PeatAlex Wilson, DO IMTS, PGY1 09/11/2013  I have fully examined this patient and agree with above findings.    And edited infull  Ccm time 30 in , precedex  Mcarthur RossettiDaniel J. Tyson AliasFeinstein, MD, FACP Pgr: [phone] [facility]

## 2013-09-11 NOTE — Progress Notes (Signed)
Paged teaching services. Pt is growing increasingly agitated and confused. Multiple attempts to leave the unit. CIWA score of 22, 2mg  of Ativan were given, and an additional 3mg  were given at 2032.

## 2013-09-11 NOTE — Progress Notes (Signed)
1045 PIV edematous, leaky. Informed Dr. Tyson AliasFeinstein. Attempted 2 insertions of PIV, unsuccessful attempts. 1100 Paged IV team. IV team attempted insertions of PIV, unsuccessful attempts. 1115 Stopped all running fluids. Informed Dr. Tyson AliasFeinstein of no IV access, gave order for PICC placement, paged IV team, called Ernestine ConradLaDonna Eubank (spouse) informed of current situation. IV team to call an obtain consent for PICC placement.
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
## 2013-09-12 ENCOUNTER — Inpatient Hospital Stay (HOSPITAL_COMMUNITY): Payer: Self-pay

## 2013-09-12 DIAGNOSIS — F1910 Other psychoactive substance abuse, uncomplicated: Secondary | ICD-10-CM

## 2013-09-12 LAB — CBC WITH DIFFERENTIAL/PLATELET
Basophils Absolute: 0.1 10*3/uL (ref 0.0–0.1)
Basophils Relative: 1 % (ref 0–1)
Eosinophils Absolute: 0.1 10*3/uL (ref 0.0–0.7)
Eosinophils Relative: 4 % (ref 0–5)
HEMATOCRIT: 41.1 % (ref 39.0–52.0)
HEMOGLOBIN: 14.1 g/dL (ref 13.0–17.0)
LYMPHS PCT: 26 % (ref 12–46)
Lymphs Abs: 0.9 10*3/uL (ref 0.7–4.0)
MCH: 35.5 pg — ABNORMAL HIGH (ref 26.0–34.0)
MCHC: 34.3 g/dL (ref 30.0–36.0)
MCV: 103.5 fL — ABNORMAL HIGH (ref 78.0–100.0)
MONO ABS: 0.7 10*3/uL (ref 0.1–1.0)
Monocytes Relative: 19 % — ABNORMAL HIGH (ref 3–12)
NEUTROS PCT: 50 % (ref 43–77)
Neutro Abs: 1.8 10*3/uL (ref 1.7–7.7)
Platelets: 52 10*3/uL — ABNORMAL LOW (ref 150–400)
RBC: 3.97 MIL/uL — ABNORMAL LOW (ref 4.22–5.81)
RDW: 14 % (ref 11.5–15.5)
WBC: 3.6 10*3/uL — AB (ref 4.0–10.5)

## 2013-09-12 LAB — BASIC METABOLIC PANEL
BUN: 13 mg/dL (ref 6–23)
CO2: 20 mEq/L (ref 19–32)
Calcium: 8.5 mg/dL (ref 8.4–10.5)
Chloride: 108 mEq/L (ref 96–112)
Creatinine, Ser: 0.48 mg/dL — ABNORMAL LOW (ref 0.50–1.35)
Glucose, Bld: 94 mg/dL (ref 70–99)
POTASSIUM: 3.8 meq/L (ref 3.7–5.3)
Sodium: 140 mEq/L (ref 137–147)

## 2013-09-12 LAB — GLUCOSE, CAPILLARY
GLUCOSE-CAPILLARY: 95 mg/dL (ref 70–99)
GLUCOSE-CAPILLARY: 91 mg/dL (ref 70–99)
GLUCOSE-CAPILLARY: 118 mg/dL — AB (ref 70–99)
GLUCOSE-CAPILLARY: 129 mg/dL — AB (ref 70–99)
Glucose-Capillary: 94 mg/dL (ref 70–99)

## 2013-09-12 LAB — HIV ANTIBODY (ROUTINE TESTING W REFLEX): HIV: NONREACTIVE

## 2013-09-12 LAB — HEPATITIS PANEL, ACUTE
HCV Ab: REACTIVE — AB
Hep A IgM: NONREACTIVE
Hep B C IgM: NONREACTIVE
Hepatitis B Surface Ag: NEGATIVE

## 2013-09-12 LAB — MAGNESIUM: MAGNESIUM: 1.5 mg/dL (ref 1.5–2.5)

## 2013-09-12 LAB — PHOSPHORUS: PHOSPHORUS: 2.9 mg/dL (ref 2.3–4.6)

## 2013-09-12 MED ORDER — CLONIDINE HCL 0.1 MG PO TABS
0.1000 mg | ORAL_TABLET | Freq: Two times a day (BID) | ORAL | Status: DC
  Administered 2013-09-12 – 2013-09-13 (×3): 0.1 mg via ORAL
  Filled 2013-09-12 (×4): qty 1

## 2013-09-12 MED ORDER — DEXMEDETOMIDINE HCL IN NACL 200 MCG/50ML IV SOLN
0.2000 ug/kg/h | INTRAVENOUS | Status: AC
  Administered 2013-09-11: 0.4 ug/kg/h via INTRAVENOUS
  Administered 2013-09-12: 0.6 ug/kg/h via INTRAVENOUS
  Administered 2013-09-12: 0.7 ug/kg/h via INTRAVENOUS
  Administered 2013-09-12: 0.6 ug/kg/h via INTRAVENOUS
  Administered 2013-09-12: 0.7 ug/kg/h via INTRAVENOUS
  Administered 2013-09-12 (×2): 0.6 ug/kg/h via INTRAVENOUS
  Filled 2013-09-12 (×6): qty 50

## 2013-09-12 MED ORDER — WHITE PETROLATUM GEL
Status: AC
  Administered 2013-09-12: 0.2
  Filled 2013-09-12: qty 5

## 2013-09-12 NOTE — Progress Notes (Signed)
PULMONARY / CRITICAL CARE MEDICINE   Name: Ronnie Mays MRN: [id number] DOB: 1966-02-16    ADMISSION DATE:  09/10/2013 CONSULTATION DATE:  4/23/12015  REFERRING MD :  Danise Edge, Dr. Kem Kays PRIMARY SERVICE: IMTS  CHIEF COMPLAINT:  Alcohol Withdrawal  BRIEF PATIENT DESCRIPTION: 48 y.o. M presents to ED from alcohol rehab due to being "off balance" for the last several days. Admitted to SDU by IMTS service on CIWA protocol. PCCM consulted for concerns of needing Precedex.  SIGNIFICANT EVENTS / STUDIES:  04/23 EtOH neg 04/23 UDS neg 04/23 CT head w/o contrast - no acute intracranial abnormality  LINES / TUBES: None   CULTURES: None   ANTIBIOTICS: None   INTERVAL HISTORY: still agitated but controlled on precedex  VITAL SIGNS: Temp:  [97.6 F (36.4 C)-98 F (36.7 C)] 97.6 F (36.4 C) (04/24 1900) Pulse Rate:  [58-127] 67 (04/25 0800) Resp:  [11-17] 14 (04/25 0600) BP: (133-181)/(87-107) 150/98 mmHg (04/25 0800) SpO2:  [93 %-98 %] 95 % (04/25 0800) Room air  HEMODYNAMICS:   VENTILATOR SETTINGS: N/A   INTAKE / OUTPUT: Intake/Output     04/24 0701 - 04/25 0700 04/25 0701 - 04/26 0700   I.V. (mL/kg) 1074 (14.2) 61.3 (0.8)   Total Intake(mL/kg) 1074 (14.2) 61.3 (0.8)   Urine (mL/kg/hr) 800 (0.4)    Total Output 800     Net +274 +61.3         PHYSICAL EXAMINATION: General:  Sleep comfortably, restrained in posey and soft wrist restraints.  Neuro:  Unable to assess  HEENT:  WNL Cardiovascular:  RRR Lungs:  occ rhonchi  Abdomen:  Soft, NT, ND Musculoskeletal:  No abnormality noted Skin:  Intact   LABS:  CBC  Recent Labs Lab 09/10/13 0430 09/10/13 0507 09/11/13 0305 09/12/13 0445  WBC 4.0  --  2.6* 3.6*  HGB 14.3 15.6 13.9 14.1  HCT 40.9 46.0 40.1 41.1  PLT 56*  --  58* 52*   BMET  Recent Labs Lab 09/10/13 0430 09/10/13 0507 09/11/13 0305 09/12/13 0445  NA 135* 139 139 140  K 3.6* 3.9 4.1 3.8  CL 100 103 105 108  CO2 20  --  22 20  BUN 13 13 10  13   CREATININE 0.60 0.70 0.51 0.48*  GLUCOSE 97 88 114* 94   Electrolytes  Recent Labs Lab 09/10/13 0430 09/11/13 0305 09/12/13 0445  CALCIUM 9.2 9.0 8.5  MG  --   --  1.5  PHOS  --   --  2.9   Liver Enzymes  Recent Labs Lab 09/10/13 0430  AST 200*  ALT 121*  ALKPHOS 138*  BILITOT 3.2*  ALBUMIN 3.5   Glucose  Recent Labs Lab 09/11/13 0421 09/11/13 1142 09/11/13 1524 09/11/13 1912 09/12/13 0005 09/12/13 0740  GLUCAP 135* 101* 100* 104* 94 95    Imaging X-ray Chest Pa And Lateral   09/10/2013   CLINICAL DATA:  Generalized body pain with agitation. Alcohol withdrawal.  EXAM: CHEST  2 VIEW  COMPARISON:  Chest radiographs 06/23/2013 and 06/21/2013.  FINDINGS: The heart size and mediastinal contours are stable. There is improved aeration of the lung bases with resolution of the previously demonstrated right middle lobe infiltrate. No focal airspace disease is demonstrated. There is mild central airway thickening. There is no pleural effusion or pneumothorax. The osseous structures appear unchanged.  IMPRESSION: Resolved right middle lobe infiltrate with mild generalized central airway thickening. No acute findings demonstrated.   Electronically Signed   By: Sandi Mariscal.D.  On: 09/10/2013 14:06   Dg Chest Port 1 View  09/12/2013   CLINICAL DATA:  Intoxication, evaluate for aspiration  EXAM: PORTABLE CHEST - 1 VIEW  COMPARISON:  Prior chest x-ray 09/11/2013  FINDINGS: Interval placement of a right upper extremity PICC. Catheter tip projects over the distal SVC. Stable cardiac and mediastinal contours. Atherosclerotic calcification noted in the transverse aorta. Very mild left basilar atelectasis. No new airspace consolidation or pleural effusion. No pneumothorax. No acute osseous abnormality. .  IMPRESSION: 1. The tip of the new right upper extremity PICC projects over the distal SVC. 2. Interval development of a trace left basilar atelectasis. Otherwise, the lungs remain  clear.   Electronically Signed   By: Malachy MoanHeath  McCullough M.D.   On: 09/12/2013 08:02   Dg Chest Port 1 View  09/11/2013   CLINICAL DATA:  Elevated liver function studies. Alcohol withdrawal. Question aspiration.  EXAM: PORTABLE CHEST - 1 VIEW  COMPARISON:  09/10/2013 and 06/23/2013.  FINDINGS: 1044 hr. The heart size and mediastinal contours are normal. The lungs are clear. There is no pleural effusion or pneumothorax. No acute osseous findings are identified.  IMPRESSION: Stable chest. No evidence of aspiration or other acute cardiopulmonary process.   Electronically Signed   By: Roxy HorsemanBill  Veazey M.D.   On: 09/11/2013 11:12   Dg Abd Portable 1v  09/11/2013   CLINICAL DATA:  Elevated liver function studies. Alcohol withdrawal.  EXAM: PORTABLE ABDOMEN - 1 VIEW  COMPARISON:  None.  FINDINGS: 1048 hr. The bowel gas pattern is normal. There is no supine evidence of free intraperitoneal air or suspicious abdominal calcification. Mild degenerative changes are present within the lower lumbar spine. No acute osseous findings are evident.  IMPRESSION: No acute abdominal findings.  No evidence of bowel obstruction.   Electronically Signed   By: Roxy HorsemanBill  Veazey M.D.   On: 09/11/2013 11:14    ASSESSMENT / PLAN:  PULMONARY A: No acute abnormality. P:   Trend O2 sats  CARDIOVASCULAR A: HTN P:  -tolerated precedex thus far -treating DT's -allow pos balance -add low clonidine  likely unable to tolerate BB  RENAL A:  At risk for AKI P:   Allow pos balance bmet in am  Lytes in am , mag, phos in am   GASTROINTESTINAL A:  R/o cirrhosis, r/o etoh hepatitis P:   No GI ppx required Start diet  US abd when more appropriate  No role steroids Repeat hep panel 4/26  HEMATOLOGIC A:  Thrombocytopenia, chronic (baseline 50-60s), currently 58 (HIV, hep panel neg 02/15)       Leukopenia, chronic, 04/23 ANC 2.3       Elevated liver enzymes, chronic - likely 2/2 to EtOH abuse / hepatitis; hepatitis panel neg in  02/15 P:  Monitor CBC with diff VTE ppx - SCDs in the setting of thrombocytopenia  Consider repeat HIV For us abdo for dx cirhosis when MS improved   INFECTIOUS A:  At risk asp P:   pcxr in am   ENDOCRINE A:  At risk hypoglycemia  P:   Add cbg  NEUROLOGIC A:  Alcohol withdrawal - very agitated, eloped from SDU 4/23 and required significant amounts of Ativan so transferred to ICU for Precedex gtt.      Tobacco use disorder       Depression - on Zoloft 50mg  daily P:   - Continue monitoring for seizure, DTs - Precedex gtt -MD driven treatment, for DT, Ativan 1 mg q6h , hold for lethargy -  Continue thiamine, folic acid - Continue soft mechanical restraints and sitter for patient safety - Continue nicotine patch - Restarted Zoloft 50mg   Summary  Still w/ active w/d. Requires precedex gtt. Will cont rx as outlined. Start nutrition. Also low dose clonidine to help transition off precedex eventually.   CC time 35 min.  Alyson ReedyWesam G. Yacoub, M.D. [facility]. Pager: [phone]. After hours pager: [phone].

## 2013-09-13 ENCOUNTER — Inpatient Hospital Stay (HOSPITAL_COMMUNITY): Payer: Self-pay

## 2013-09-13 DIAGNOSIS — R7402 Elevation of levels of lactic acid dehydrogenase (LDH): Secondary | ICD-10-CM

## 2013-09-13 DIAGNOSIS — J189 Pneumonia, unspecified organism: Secondary | ICD-10-CM

## 2013-09-13 DIAGNOSIS — R740 Nonspecific elevation of levels of transaminase and lactic acid dehydrogenase [LDH]: Secondary | ICD-10-CM

## 2013-09-13 LAB — CBC
HCT: 41.1 % (ref 39.0–52.0)
Hemoglobin: 14.5 g/dL (ref 13.0–17.0)
MCH: 36.1 pg — ABNORMAL HIGH (ref 26.0–34.0)
MCHC: 35.3 g/dL (ref 30.0–36.0)
MCV: 102.2 fL — ABNORMAL HIGH (ref 78.0–100.0)
Platelets: 60 10*3/uL — ABNORMAL LOW (ref 150–400)
RBC: 4.02 MIL/uL — ABNORMAL LOW (ref 4.22–5.81)
RDW: 13.7 % (ref 11.5–15.5)
WBC: 3.6 10*3/uL — ABNORMAL LOW (ref 4.0–10.5)

## 2013-09-13 LAB — BASIC METABOLIC PANEL
BUN: 9 mg/dL (ref 6–23)
CALCIUM: 8.7 mg/dL (ref 8.4–10.5)
CHLORIDE: 100 meq/L (ref 96–112)
CO2: 19 mEq/L (ref 19–32)
CREATININE: 0.46 mg/dL — AB (ref 0.50–1.35)
GFR calc Af Amer: >90 mL/min (ref >90)
GFR calc non Af Amer: >90 mL/min (ref >90)
Glucose, Bld: 91 mg/dL (ref 70–99)
Potassium: 5.1 mEq/L (ref 3.7–5.3)
Sodium: 129 mEq/L — ABNORMAL LOW (ref 137–147)

## 2013-09-13 LAB — COMPREHENSIVE METABOLIC PANEL
ALT: 93 U/L — ABNORMAL HIGH (ref 0–53)
AST: 134 U/L — ABNORMAL HIGH (ref 0–37)
Albumin: 2.2 g/dL — ABNORMAL LOW (ref 3.5–5.2)
Alkaline Phosphatase: 72 U/L (ref 39–117)
BUN: 8 mg/dL (ref 6–23)
CO2: 17 mEq/L — ABNORMAL LOW (ref 19–32)
Calcium: 5.9 mg/dL — CL (ref 8.4–10.5)
Chloride: 113 mEq/L — ABNORMAL HIGH (ref 96–112)
Creatinine, Ser: 0.37 mg/dL — ABNORMAL LOW (ref 0.50–1.35)
GFR calc Af Amer: >90 mL/min (ref >90)
GFR calc non Af Amer: >90 mL/min (ref >90)
Glucose, Bld: 88 mg/dL (ref 70–99)
Potassium: 2.7 mEq/L — CL (ref 3.7–5.3)
Sodium: 142 mEq/L (ref 137–147)
Total Bilirubin: 2.3 mg/dL — ABNORMAL HIGH (ref 0.3–1.2)
Total Protein: 5.8 g/dL — ABNORMAL LOW (ref 6.0–8.3)

## 2013-09-13 LAB — DIFFERENTIAL
BASOS PCT: 2 % — AB (ref 0–1)
Basophils Absolute: 0.1 10*3/uL (ref 0.0–0.1)
EOS ABS: 0.1 10*3/uL (ref 0.0–0.7)
Eosinophils Relative: 4 % (ref 0–5)
Lymphocytes Relative: 27 % (ref 12–46)
Lymphs Abs: 1 10*3/uL (ref 0.7–4.0)
Monocytes Absolute: 0.7 10*3/uL (ref 0.1–1.0)
Monocytes Relative: 19 % — ABNORMAL HIGH (ref 3–12)
NEUTROS ABS: 1.8 10*3/uL (ref 1.7–7.7)
Neutrophils Relative %: 49 % (ref 43–77)

## 2013-09-13 LAB — GLUCOSE, CAPILLARY
GLUCOSE-CAPILLARY: 217 mg/dL — AB (ref 70–99)
GLUCOSE-CAPILLARY: 115 mg/dL — AB (ref 70–99)
GLUCOSE-CAPILLARY: 127 mg/dL — AB (ref 70–99)
Glucose-Capillary: 107 mg/dL — ABNORMAL HIGH (ref 70–99)
Glucose-Capillary: 297 mg/dL — ABNORMAL HIGH (ref 70–99)

## 2013-09-13 LAB — AMMONIA: Ammonia: 78 umol/L — ABNORMAL HIGH (ref 11–60)

## 2013-09-13 LAB — MAGNESIUM
Magnesium: 1 mg/dL — ABNORMAL LOW (ref 1.5–2.5)
Magnesium: 2 mg/dL (ref 1.5–2.5)

## 2013-09-13 LAB — PROTIME-INR
INR: 1.38 (ref 0.00–1.49)
Prothrombin Time: 16.6 seconds — ABNORMAL HIGH (ref 11.6–15.2)

## 2013-09-13 LAB — APTT: aPTT: 36 seconds (ref 24–37)

## 2013-09-13 LAB — PHOSPHORUS: PHOSPHORUS: 2.5 mg/dL (ref 2.3–4.6)

## 2013-09-13 MED ORDER — SODIUM CHLORIDE 0.9 % IV SOLN
6.0000 g | Freq: Once | INTRAVENOUS | Status: AC
  Administered 2013-09-13: 6 g via INTRAVENOUS
  Filled 2013-09-13: qty 12

## 2013-09-13 MED ORDER — SERTRALINE HCL 50 MG PO TABS
50.0000 mg | ORAL_TABLET | Freq: Every day | ORAL | Status: DC
  Administered 2013-09-13: 50 mg via ORAL
  Filled 2013-09-13 (×2): qty 1

## 2013-09-13 MED ORDER — POTASSIUM CHLORIDE 10 MEQ/50ML IV SOLN: 10.0000 meq | INTRAVENOUS | Status: DC

## 2013-09-13 MED ORDER — POTASSIUM CHLORIDE CRYS ER 20 MEQ PO TBCR
40.0000 meq | EXTENDED_RELEASE_TABLET | ORAL | Status: AC
  Administered 2013-09-13 (×2): 40 meq via ORAL
  Filled 2013-09-13 (×3): qty 2

## 2013-09-13 MED ORDER — POTASSIUM CHLORIDE 10 MEQ/100ML IV SOLN
10.0000 meq | INTRAVENOUS | Status: AC
  Administered 2013-09-13 (×4): 10 meq via INTRAVENOUS
  Filled 2013-09-13: qty 100

## 2013-09-13 MED ORDER — HYDRALAZINE HCL 20 MG/ML IJ SOLN: 10.0000 mg | INTRAMUSCULAR | Status: DC | PRN

## 2013-09-13 MED ORDER — SODIUM CHLORIDE 0.9 % IV SOLN: INTRAVENOUS | Status: DC

## 2013-09-13 MED ORDER — CLONIDINE HCL 0.2 MG/24HR TD PTWK
0.2000 mg | MEDICATED_PATCH | TRANSDERMAL | Status: DC
  Administered 2013-09-13: 0.2 mg via TRANSDERMAL
  Filled 2013-09-13: qty 1

## 2013-09-13 MED ORDER — DEXMEDETOMIDINE HCL IN NACL 200 MCG/50ML IV SOLN
0.2000 ug/kg/h | INTRAVENOUS | Status: DC
Start: 2013-09-13 — End: 2013-09-13
  Administered 2013-09-13 (×4): 0.7 ug/kg/h via INTRAVENOUS
  Filled 2013-09-13 (×3): qty 50

## 2013-09-13 NOTE — Progress Notes (Signed)
[facility] FOR [facility] LAB REPLACEMENT ONLY  The patient does apply [facility] the [facility] Protocol based on the criteria listed below:   1. Is GFR >/= 40 ml/min? yes  Patient's GFR today is >90 2. Is urine output >/= 0.5 ml/kg/hr for the last 6 hours? yes Patient's UOP is 1.6 ml/kg/hr 3. Is BUN < 60 mg/dL? yes  Patient's BUN today is 8 4. Abnormal electrolyte(s): K+2.7  Mg 1.6 5. Ordered repletion with: protocol 6. If a panic level lab has been reported, has the CCM MD in charge been notified? yes.   Physician:  E Deterding  Janace HoardPaul Hilliard Deakin Lacek 09/13/2013 6:33 AM

## 2013-09-13 NOTE — Progress Notes (Signed)
PULMONARY / CRITICAL CARE MEDICINE   Name: Ronnie Mays MRN: [id number] DOB: August 19, 1965    ADMISSION DATE:  09/10/2013 CONSULTATION DATE:  4/23/12015  REFERRING MD :  Danise EdgeIMTS, Dr. Kem KaysPaya PRIMARY SERVICE: IMTS  CHIEF COMPLAINT:  Alcohol Withdrawal  BRIEF PATIENT DESCRIPTION: 48 y.o. M presents to ED from alcohol rehab due to being "off balance" for the last several days. Admitted to SDU by IMTS service on CIWA protocol. PCCM consulted for concerns of needing Precedex.  SIGNIFICANT EVENTS / STUDIES:  04/23 EtOH neg 04/23 UDS neg 04/23 CT head w/o contrast - no acute intracranial abnormality  LINES / TUBES: 04/24 RUE PICC >>>  CULTURES: None   ANTIBIOTICS: None   INTERVAL HISTORY: CIWA score 21 overnight; required two prn doses of Ativan.  CIWA 8 this AM.  Still confused but less combative on Precedex at 0.7 mcg/kg/hr.  Currently AAO x 3.  Says he is still a little tremulous but feels better than at admission.  Appetite is improving.     VITAL SIGNS: Temp:  [97.6 F (36.4 C)-98.5 F (36.9 C)] 98.5 F (36.9 C) (04/26 0419) Pulse Rate:  [58-73] 65 (04/26 0500) Resp:  [14-20] 16 (04/26 0500) BP: (113-176)/(70-103) 129/89 mmHg (04/26 0400) SpO2:  [92 %-97 %] 92 % (04/26 0500) FiO2 (%):  [30 %] 30 % (04/25 1900) Room air   HEMODYNAMICS:   VENTILATOR SETTINGS: N/A  INTAKE / OUTPUT: Intake/Output     04/25 0701 - 04/26 0700   P.O. 1430   I.V. (mL/kg) 1432.2 (18.9)   Total Intake(mL/kg) 2862.2 (37.8)   Urine (mL/kg/hr) 2350 (1.3)   Total Output 2350   Net +512.2         PHYSICAL EXAMINATION: General:  Asleep but easily arousable, restrained in posey and soft wrist restraints.  Neuro:  AAO x 3, responding appropriately, mild tremor of outstretched hands HEENT:  WNL Cardiovascular:  RRR Lungs:  CTA B/L Abdomen:  Soft, NT, ND Musculoskeletal:  No abnormality noted Skin:  Intact   LABS:  CBC  Recent Labs Lab 09/11/13 0305 09/12/13 0445 09/13/13 0540  WBC  2.6* 3.6* 3.6*  HGB 13.9 14.1 14.5  HCT 40.1 41.1 41.1  PLT 58* 52* 60*   BMET  Recent Labs Lab 09/11/13 0305 09/12/13 0445 09/13/13 0440  NA 139 140 142  K 4.1 3.8 2.7*  CL 105 108 113*  CO2 22 20 17*  BUN 10 13 8   CREATININE 0.51 0.48* 0.37*  GLUCOSE 114* 94 88   Electrolytes  Recent Labs Lab 09/11/13 0305 09/12/13 0445 09/13/13 0440  CALCIUM 9.0 8.5 5.9*  MG  --  1.5 1.0*  PHOS  --  2.9 2.5   Liver Enzymes  Recent Labs Lab 09/10/13 0430 09/13/13 0440  AST 200* 134*  ALT 121* 93*  ALKPHOS 138* 72  BILITOT 3.2* 2.3*  ALBUMIN 3.5 2.2*   Glucose  Recent Labs Lab 09/12/13 0740 09/12/13 1058 09/12/13 1514 09/12/13 1947 09/13/13 09/13/13 0351  GLUCAP 95 91 118* 129* 297* 217*    Imaging Dg Chest Port 1 View  09/13/2013   CLINICAL DATA:  Alcohol withdrawal.  Evaluate for aspiration.  EXAM: PORTABLE CHEST - 1 VIEW  COMPARISON:  09/12/2013  FINDINGS: Right PICC remains in place with tip overlying the lower SVC. Cardiac silhouette is upper limits of normal in size, unchanged. Lungs are slightly hypoinflated with persistent, minimal opacity in the left lung base. Right lung remains clear. No pleural effusion or pneumothorax is identified.  IMPRESSION: Unchanged,  minimal left basilar atelectasis.   Electronically Signed   By: Sebastian Ache   On: 09/13/2013 07:14   Dg Chest Port 1 View  09/12/2013   CLINICAL DATA:  Intoxication, evaluate for aspiration  EXAM: PORTABLE CHEST - 1 VIEW  COMPARISON:  Prior chest x-ray 09/11/2013  FINDINGS: Interval placement of a right upper extremity PICC. Catheter tip projects over the distal SVC. Stable cardiac and mediastinal contours. Atherosclerotic calcification noted in the transverse aorta. Very mild left basilar atelectasis. No new airspace consolidation or pleural effusion. No pneumothorax. No acute osseous abnormality. .  IMPRESSION: 1. The tip of the new right upper extremity PICC projects over the distal SVC. 2. Interval  development of a trace left basilar atelectasis. Otherwise, the lungs remain clear.   Electronically Signed   By: Malachy Moan M.D.   On: 09/12/2013 08:02   Dg Chest Port 1 View  09/11/2013   CLINICAL DATA:  Elevated liver function studies. Alcohol withdrawal. Question aspiration.  EXAM: PORTABLE CHEST - 1 VIEW  COMPARISON:  09/10/2013 and 06/23/2013.  FINDINGS: 1044 hr. The heart size and mediastinal contours are normal. The lungs are clear. There is no pleural effusion or pneumothorax. No acute osseous findings are identified.  IMPRESSION: Stable chest. No evidence of aspiration or other acute cardiopulmonary process.   Electronically Signed   By: Roxy Horseman M.D.   On: 09/11/2013 11:12   Dg Abd Portable 1v  09/11/2013   CLINICAL DATA:  Elevated liver function studies. Alcohol withdrawal.  EXAM: PORTABLE ABDOMEN - 1 VIEW  COMPARISON:  None.  FINDINGS: 1048 hr. The bowel gas pattern is normal. There is no supine evidence of free intraperitoneal air or suspicious abdominal calcification. Mild degenerative changes are present within the lower lumbar spine. No acute osseous findings are evident.  IMPRESSION: No acute abdominal findings.  No evidence of bowel obstruction.   Electronically Signed   By: Roxy Horseman M.D.   On: 09/11/2013 11:14   04/26 CXR:  Unchanged minimal left basilar atelectasis  ASSESSMENT / PLAN:  PULMONARY A: No acute abnormality. P:   - Monitor O2 sats  CARDIOVASCULAR A: HTN P:  - Tolerating Precedex - Treating DT's - Allow pos balance - Change clonidine to patch at 0.2 and add hydralazine for SBP>150  RENAL A:  At risk for AKI Hypokalemia Hypomagnesemia  Hypocalcemia - corrected Ca 7.3, will likely increase with Mg supplementation  P:   - Continue IVF - Allow pos balance - Monitor BMP, Mg, Phos  - 6g IV Mg - po K - IV K as ordered.  GASTROINTESTINAL A:  HCV infx, r/o cirrhosis (in the setting of HCV and EtOH abuse) P:   - No GI ppx  required - Regular diet  - [place] abd to evaluate liver when more appropriate  - No role for steroids  HEMATOLOGIC A:  Thrombocytopenia, chronic (baseline 50-60s), currently 60 (HIV neg 04/15)       Leukopenia, chronic, 04/23 ANC 1.8       Elevated liver enzymes, chronic - likely 2/2 to Hep C/EtOH abuse  P:  - Monitor CBC with diff - VTE ppx - SCDs in the setting of thrombocytopenia  - [place] abd to evaluate liver when more appropriate - Check coags  INFECTIOUS A:  At risk for aspiration      Hepatitis C P:   - pcxr in am  - F/u with ID/HCV clinic as an outpatient  ENDOCRINE A:  At risk for hypoglycemia  P:   - CBG monitoring  NEUROLOGIC A:  Alcohol withdrawal - very agitated, eloped from SDU 4/23 and required significant amounts of Ativan so transferred to ICU for Precedex gtt.  Improved mentation and less agitated this AM.       Tobacco use disorder       Depression - on Zoloft 50mg  daily at home        Hx of hyperammonemia (02/15) - previously prescribed Lactulose P:   - Continue monitoring for seizure, DTs - Continue Precedex gtt, clonidine  - MD driven treatment for DT, Ativan 1 mg q6h, hold for lethargy - Continue thiamine, folic acid - Continue soft mechanical restraints and sitter for patient safety - Continue nicotine patch - Restart Zoloft 50mg  - Check ammonia level  Evelena PeatAlex Wilson, DO IMTS, PGY1  CIWA scores are elevated overnight but improving, tremulous, remains on precedex but arousable.  Will attempt to get off precedex today and move out of the ICU in AM.  Change clonidine to patch and PRN hydralazine.  CC time 35 minutes.  Patient seen and examined, agree with above note.  I dictated the care and orders written for this patient under my direction.  Alyson ReedyWesam G Evangelyn Crouse, MD [phone]

## 2013-09-13 NOTE — Progress Notes (Signed)
CRITICAL VALUE ALERT  Critical value received:  K+  2.7  Date of notification:  04/26  Time of notification:  0600  Critical value read back:yes  Nurse who received alert:  G. Myer HaffEpling / RN  MD notified (1st page):  Dr. Andrey CampanileWilson  Time of first page:  0630  MD notified (2nd page):  Time of second page:  Responding MD:  Dr. Andrey CampanileWilson  Time MD responded:  0630

## 2013-09-13 NOTE — Progress Notes (Signed)
CRITICAL VALUE ALERT  Critical value received:  Calcium - 5.9  Date of notification:  04/26  Time of notification:  0600 a.m.  Critical value read back:yes  Nurse who received alert:  G. Cece Milhouse/ RN  MD notified (1st page):  Dr. Andrey CampanileWilson  Time of first page:  0630  MD notified (2nd page):  Time of second page:  Responding MD:  Dr. Andrey CampanileWilson  Time MD responded:  0630

## 2013-09-14 ENCOUNTER — Inpatient Hospital Stay (HOSPITAL_COMMUNITY): Payer: Self-pay

## 2013-09-14 DIAGNOSIS — E871 Hypo-osmolality and hyponatremia: Secondary | ICD-10-CM

## 2013-09-14 LAB — BASIC METABOLIC PANEL
BUN: 10 mg/dL (ref 6–23)
CO2: 19 meq/L (ref 19–32)
Calcium: 8.6 mg/dL (ref 8.4–10.5)
Chloride: 102 mEq/L (ref 96–112)
Creatinine, Ser: 0.51 mg/dL (ref 0.50–1.35)
GFR calc Af Amer: >90 mL/min (ref >90)
GFR calc non Af Amer: >90 mL/min (ref >90)
GLUCOSE: 83 mg/dL (ref 70–99)
POTASSIUM: 4.3 meq/L (ref 3.7–5.3)
SODIUM: 133 meq/L — AB (ref 137–147)

## 2013-09-14 LAB — CBC WITH DIFFERENTIAL/PLATELET
Basophils Absolute: 0.1 10*3/uL (ref 0.0–0.1)
Basophils Relative: 1 % (ref 0–1)
EOS ABS: 0.2 10*3/uL (ref 0.0–0.7)
EOS PCT: 3 % (ref 0–5)
HCT: 39.9 % (ref 39.0–52.0)
HEMOGLOBIN: 14 g/dL (ref 13.0–17.0)
LYMPHS ABS: 1.1 10*3/uL (ref 0.7–4.0)
Lymphocytes Relative: 21 % (ref 12–46)
MCH: 35.8 pg — AB (ref 26.0–34.0)
MCHC: 35.1 g/dL (ref 30.0–36.0)
MCV: 102 fL — AB (ref 78.0–100.0)
MONO ABS: 1.1 10*3/uL — AB (ref 0.1–1.0)
Monocytes Relative: 21 % — ABNORMAL HIGH (ref 3–12)
Neutro Abs: 2.9 10*3/uL (ref 1.7–7.7)
Neutrophils Relative %: 54 % (ref 43–77)
Platelets: 74 10*3/uL — ABNORMAL LOW (ref 150–400)
RBC: 3.91 MIL/uL — AB (ref 4.22–5.81)
RDW: 13.8 % (ref 11.5–15.5)
WBC: 5.4 10*3/uL (ref 4.0–10.5)

## 2013-09-14 LAB — URIC ACID: Uric Acid, Serum: 4.8 mg/dL (ref 4.0–7.8)

## 2013-09-14 LAB — VITAMIN B12: VITAMIN B 12: 1753 pg/mL — AB (ref 211–911)

## 2013-09-14 LAB — OSMOLALITY: Osmolality: 275 mOsm/kg (ref 275–300)

## 2013-09-14 LAB — OSMOLALITY, URINE: OSMOLALITY UR: 554 mosm/kg (ref 390–1090)

## 2013-09-14 LAB — TSH: TSH: 2.01 u[IU]/mL (ref 0.350–4.500)

## 2013-09-14 LAB — MAGNESIUM: Magnesium: 1.8 mg/dL (ref 1.5–2.5)

## 2013-09-14 LAB — PHOSPHORUS: Phosphorus: 3.7 mg/dL (ref 2.3–4.6)

## 2013-09-14 LAB — CREATININE, URINE, RANDOM: CREATININE, URINE: 56.87 mg/dL

## 2013-09-14 LAB — SODIUM, URINE, RANDOM: Sodium, Ur: 121 mEq/L

## 2013-09-14 MED ORDER — VITAMIN B-1 100 MG PO TABS
100.0000 mg | ORAL_TABLET | Freq: Every day | ORAL | Status: DC
  Administered 2013-09-15: 100 mg via ORAL
  Filled 2013-09-14: qty 1

## 2013-09-14 MED ORDER — IBUPROFEN 600 MG PO TABS
600.0000 mg | ORAL_TABLET | Freq: Three times a day (TID) | ORAL | Status: DC | PRN
  Administered 2013-09-14 (×2): 600 mg via ORAL
  Filled 2013-09-14 (×2): qty 1

## 2013-09-14 MED ORDER — LORAZEPAM 1 MG PO TABS
1.0000 mg | ORAL_TABLET | Freq: Four times a day (QID) | ORAL | Status: DC
  Administered 2013-09-14 – 2013-09-15 (×4): 1 mg via ORAL
  Filled 2013-09-14 (×5): qty 1

## 2013-09-14 MED ORDER — FOLIC ACID 1 MG PO TABS
1.0000 mg | ORAL_TABLET | Freq: Every day | ORAL | Status: DC
  Administered 2013-09-15: 1 mg via ORAL
  Filled 2013-09-14: qty 1

## 2013-09-14 NOTE — Progress Notes (Signed)
PULMONARY / CRITICAL CARE MEDICINE   Name: Ronnie Mays MRN: [id number] DOB: [birth year]    ADMISSION DATE:  09/10/2013 CONSULTATION DATE:  4/23/12015  REFERRING MD :  Danise EdgeIMTS, Dr. Kem KaysPaya PRIMARY SERVICE: IMTS  CHIEF COMPLAINT:  Alcohol Withdrawal  BRIEF PATIENT DESCRIPTION: 48 y.o. M presents to ED from alcohol rehab due to being "off balance" for the last several days. Admitted to SDU by IMTS service on CIWA protocol. PCCM consulted for concerns of needing Precedex.  SIGNIFICANT EVENTS / STUDIES:  04/23 EtOH neg 04/23 UDS neg 04/23 CT head w/o contrast - no acute intracranial abnormality  LINES / TUBES: 04/24 RUE PICC >>>  CULTURES: None   ANTIBIOTICS: None   INTERVAL HISTORY: Highest CIWA score was 11 overnight.  He denies anxiety and says he feels better than at admission.  He says he has been drinking a lot of Sprite and water.    Off precedex since 4/26 pm  VITAL SIGNS: Temp:  [98.3 F (36.8 C)-98.8 F (37.1 C)] 98.4 F (36.9 C) (04/27 0400) Pulse Rate:  [55-77] 77 (04/27 0700) Resp:  [10-23] 20 (04/27 0700) BP: (97-167)/(68-110) 115/74 mmHg (04/27 0700) SpO2:  [91 %-100 %] 93 % (04/27 0700) Room air   HEMODYNAMICS:   VENTILATOR SETTINGS: N/A  INTAKE / OUTPUT: Intake/Output     04/26 0701 - 04/27 0700 04/27 0701 - 04/28 0700   P.O. 480    I.V. (mL/kg) 1050.5 (13.9)    IV Piggyback 400    Total Intake(mL/kg) 1930.5 (25.5)    Urine (mL/kg/hr) 1650 (0.9)    Total Output 1650     Net +280.5          Urine Occurrence 1 x    Stool Occurrence 1 x      PHYSICAL EXAMINATION: General:  Sitting up in chair in NAD Neuro:  AAO x 3, responding appropriately, mild tremor of outstretched hands HEENT:  WNL Cardiovascular:  RRR Lungs:  CTA B/L Abdomen:  Soft, ND, mild diffuse tenderness Musculoskeletal:  No abnormality noted Skin:  Intact   LABS:  CBC  Recent Labs Lab 09/12/13 0445 09/13/13 0540 09/14/13 0500  WBC 3.6* 3.6* 5.4  HGB 14.1 14.5 14.0  HCT  41.1 41.1 39.9  PLT 52* 60* 74*   BMET  Recent Labs Lab 09/13/13 0440 09/13/13 2130 09/14/13 0500  NA 142 129* 133*  K 2.7* 5.1 4.3  CL 113* 100 102  CO2 17* 19 19  BUN 8 9 10   CREATININE 0.37* 0.46* 0.51  GLUCOSE 88 91 83   Electrolytes  Recent Labs Lab 09/12/13 0445 09/13/13 0440 09/13/13 2130 09/14/13 0500  CALCIUM 8.5 5.9* 8.7 8.6  MG 1.5 1.0* 2.0 1.8  PHOS 2.9 2.5  --  3.7   Liver Enzymes  Recent Labs Lab 09/10/13 0430 09/13/13 0440  AST 200* 134*  ALT 121* 93*  ALKPHOS 138* 72  BILITOT 3.2* 2.3*  ALBUMIN 3.5 2.2*   Glucose  Recent Labs Lab 09/12/13 1947 09/13/13 09/13/13 0351 09/13/13 0708 09/13/13 1106 09/13/13 1548  GLUCAP 129* 297* 217* 115* 107* 127*    Imaging Dg Chest Port 1 View  09/13/2013   CLINICAL DATA:  Alcohol withdrawal.  Evaluate for aspiration.  EXAM: PORTABLE CHEST - 1 VIEW  COMPARISON:  09/12/2013  FINDINGS: Right PICC remains in place with tip overlying the lower SVC. Cardiac silhouette is upper limits of normal in size, unchanged. Lungs are slightly hypoinflated with persistent, minimal opacity in the left lung base. Right lung remains  clear. No pleural effusion or pneumothorax is identified.  IMPRESSION: Unchanged, minimal left basilar atelectasis.   Electronically Signed   By: Sebastian AcheAllen  Grady   On: 09/13/2013 07:14   ASSESSMENT / PLAN:  PULMONARY A: No acute abnormality. P:   - Monitor O2 sats   CARDIOVASCULAR A: HTN P:  - Treating DT's - Allow pos balance - Continue clonidine patch at 0.2 and prn hydralazine for SBP>150; ? Transition to HCTZ for home   RENAL A:  At risk for AKI Hypokalemia, resolved  Hypomagnesemia, resolved Hypocalcemia, resolved Hyponatremia - acute; serum osm 275 (hypotonic), Ur osm 554 (concentrated), Urine Na 121 FeNa < 1%; TSH 2.010; ?SIADH (restarted Zoloft yesterday) P:   - Fluid restrict - Regular diet - D/c Zoloft for now, consider restart before d/c home - Monitor BMP, Mg, Phos     GASTROINTESTINAL A:  HCV infx, r/o cirrhosis (in the setting of HCV and EtOH abuse) P:   - No GI ppx required - Regular diet  - [place] abd to evaluate liver when more appropriate    HEMATOLOGIC A:  Thrombocytopenia, chronic (baseline 50-60s), currently 74 (HIV neg 04/15)       Leukopenia, chronic, 04/23 ANC 1.8       Elevated liver enzymes, chronic - likely 2/2 to Hep C/EtOH abuse  P:  - Monitor CBC with diff - VTE ppx - SCDs in the setting of thrombocytopenia  - US abd to evaluate liver when more appropriate   INFECTIOUS A:  At risk for aspiration      Hepatitis C P:   - monitor for signs and symptoms of aspiration pna - F/u with ID/HCV clinic as an outpatient; he will require hep A and B vaccination   ENDOCRINE A:  At risk for hypoglycemia  P:   - CBG monitoring   NEUROLOGIC A:  Alcohol withdrawal - very agitated, eloped from SDU 4/23 and required significant amounts of Ativan so transferred to ICU for Precedex gtt.  Improved mentation and less agitated this AM.       Tobacco use disorder       Depression - on Zoloft 50mg  daily at home        Hx of hyperammonemia (02/15) - previously prescribed Lactulose P:   - Continue monitoring for seizure, DTs - now off Precedex gtt, on clonidine  - MD driven treatment for DT, Ativan 1 mg q6h, hold for lethargy - Continue thiamine, folic acid - Continue soft mechanical restraints and sitter for patient safety - Continue nicotine patch - Hold Zoloft 50mg    Evelena PeatAlex Wilson, DO IMTS, PGY1  Will transfer to floor bed, to IMTS as of 4/28.   Levy Pupaobert Kahlan Engebretson, MD, PhD 09/14/2013, 10:11 AM [facility] [phone] or if no answer [phone]

## 2013-09-14 NOTE — Evaluation (Signed)
Physical Therapy Evaluation Patient Details Name: Ronnie Mays MRN: [id number] DOB: May 05, 1966 Today's Date: 09/14/2013   History of Present Illness  48 YO man who is coming to the ED from alcohol rehab. He states that his last drink was last Thursday and that he has been off balance the last several days. He thinks that his shakes are somewhat better and overall he is doing okay. He is having a lot of stress and his marriage is breaking up and this is making him upset. He is having a headache and backache. He is nauseated but not vomiting or having diarrhea. He is not constipated. He denies chest pain or SOB. He states that he is tired of people asking him questions and is not super cooperative with the interview. He is trying to get out of bed during our talk but is easily distractible. He denies any falls recently although he has been bumping into walls a lot recently due to the balance issues.   Clinical Impression  Pt presents with impaired balance, gait and mobility as well as decreased safety awareness.  Pt will benefit from skilled PT services to address deficits and increase safety with mobility.  PT recommends 24 hour supervision at home due to increased fall risk    Follow Up Recommendations Home health PT;Supervision/Assistance - 24 hour    Equipment Recommendations  None recommended by PT    Recommendations for Other Services       Precautions / Restrictions Precautions Precautions: Fall Restrictions Weight Bearing Restrictions: No      Mobility  Bed Mobility Overal bed mobility: Modified Independent                Transfers Overall transfer level: Needs assistance   Transfers: Sit to/from Stand Sit to Stand: Min assist         General transfer comment: steadying assist for balance  Ambulation/Gait Ambulation/Gait assistance: Min assist;Mod assist Ambulation Distance (Feet): 200 Feet Assistive device: None     Gait velocity interpretation: Below  normal speed for age/gender General Gait Details: pt with ataxic gait, increased lateral sway, requires min A for controlled environment, mod A for balance with dynamic gait challenges  Stairs Stairs: Yes Stairs assistance: Min assist Stair Management: One rail Right Number of Stairs: 14 General stair comments: needs cues for safety, will benefit from step to pattern for safety  Wheelchair Mobility    Modified Rankin (Stroke Patients Only)       Balance                                             Pertinent Vitals/Pain No c/o pain    Home Living Family/patient expects to be discharged to:: Private residence Living Arrangements: Spouse/significant other (ex wife) Available Help at Discharge: Family;Available PRN/intermittently Type of Home: House Home Access: Stairs to enter Entrance Stairs-Rails: Right Entrance Stairs-Number of Steps: 8 Home Layout: Two level Home Equipment: None      Prior Function Level of Independence: Independent               Hand Dominance        Extremity/Trunk Assessment               Lower Extremity Assessment: Generalized weakness      Cervical / Trunk Assessment: Normal  Communication   Communication: No difficulties  Cognition Arousal/Alertness: Awake/alert Behavior During  Therapy: WFL for tasks assessed/performed Overall Cognitive Status: Within Functional Limits for tasks assessed                      General Comments      Exercises        Assessment/Plan    PT Assessment Patient needs continued PT services  PT Diagnosis Difficulty walking;Abnormality of gait;Generalized weakness   PT Problem List Decreased activity tolerance;Decreased balance;Decreased mobility;Decreased coordination;Decreased safety awareness  PT Treatment Interventions DME instruction;Balance training;Modalities;Gait training;Neuromuscular re-education;Stair training;Patient/family education;Functional  mobility training;Therapeutic activities;Therapeutic exercise   PT Goals (Current goals can be found in the Care Plan section) Acute Rehab PT Goals Patient Stated Goal: get better PT Goal Formulation: With patient Time For Goal Achievement: 09/21/13 Potential to Achieve Goals: Good    Frequency Min 3X/week   Barriers to discharge Decreased caregiver support home alone most of the day    Co-evaluation               End of Session Equipment Utilized During Treatment: Gait belt Activity Tolerance: Patient tolerated treatment well Patient left: in bed;with call bell/phone within reach;with bed alarm set Nurse Communication: Mobility status         Time: [id number] PT Time Calculation (min): 21 min   Charges:   PT Evaluation $Initial PT Evaluation Tier I: 1 Procedure PT Treatments $Gait Training: 8-22 mins   PT G Codes:          Leone BrandKaren K Donawerth 09/14/2013, 1:14 PM

## 2013-09-14 NOTE — Progress Notes (Signed)
Pt. Transferred from 5M to 6N18.  Pt. Up walking in room, looking for cell phone and roll of quarters.  5M nurse called daughter who stated his previous facility has all of his belongings.  Pt. Made aware.  VSS.  Pt. In no distress. Now relaxing in bed.  Will continue to monitor. Vanice Sarahaylor L Thompson

## 2013-09-14 NOTE — Progress Notes (Signed)
Pt. 's wife called me into room to state that the facility the patient was at does not have his s[employer] cell phone.  She also stated he is missing his set of house keys.  I called ED and they have not found anything.  I also called security and they also do not have either of the items. I informed the pt. And family member. Vanice Sarahaylor L Thompson

## 2013-09-15 LAB — CBC WITH DIFFERENTIAL/PLATELET
BASOS ABS: 0.1 10*3/uL (ref 0.0–0.1)
Basophils Relative: 1 % (ref 0–1)
EOS ABS: 0.2 10*3/uL (ref 0.0–0.7)
Eosinophils Relative: 5 % (ref 0–5)
HCT: 37.5 % — ABNORMAL LOW (ref 39.0–52.0)
Hemoglobin: 13.1 g/dL (ref 13.0–17.0)
LYMPHS PCT: 29 % (ref 12–46)
Lymphs Abs: 1 10*3/uL (ref 0.7–4.0)
MCH: 35.8 pg — AB (ref 26.0–34.0)
MCHC: 34.9 g/dL (ref 30.0–36.0)
MCV: 102.5 fL — ABNORMAL HIGH (ref 78.0–100.0)
Monocytes Absolute: 1 10*3/uL (ref 0.1–1.0)
Monocytes Relative: 29 % — ABNORMAL HIGH (ref 3–12)
Neutro Abs: 1.3 10*3/uL — ABNORMAL LOW (ref 1.7–7.7)
Neutrophils Relative %: 37 % — ABNORMAL LOW (ref 43–77)
PLATELETS: 67 10*3/uL — AB (ref 150–400)
RBC: 3.66 MIL/uL — ABNORMAL LOW (ref 4.22–5.81)
RDW: 14 % (ref 11.5–15.5)
WBC: 3.5 10*3/uL — AB (ref 4.0–10.5)

## 2013-09-15 LAB — BASIC METABOLIC PANEL
BUN: 10 mg/dL (ref 6–23)
CALCIUM: 8.4 mg/dL (ref 8.4–10.5)
CO2: 23 mEq/L (ref 19–32)
Chloride: 105 mEq/L (ref 96–112)
Creatinine, Ser: 0.54 mg/dL (ref 0.50–1.35)
GFR calc Af Amer: >90 mL/min (ref >90)
Glucose, Bld: 107 mg/dL — ABNORMAL HIGH (ref 70–99)
Potassium: 3.8 mEq/L (ref 3.7–5.3)
Sodium: 138 mEq/L (ref 137–147)

## 2013-09-15 LAB — PHOSPHORUS: PHOSPHORUS: 3.9 mg/dL (ref 2.3–4.6)

## 2013-09-15 LAB — MAGNESIUM: Magnesium: 1.7 mg/dL (ref 1.5–2.5)

## 2013-09-15 MED ORDER — HYDROCHLOROTHIAZIDE 25 MG PO TABS: 25.0000 mg | ORAL_TABLET | Freq: Every day | ORAL | Status: AC

## 2013-09-15 NOTE — Progress Notes (Signed)
Discharge instructions/prescription for hydrochlorothiazide given and explained to pt. And pt.'s stepdaughter.  Both verbalized understanding of all orders/instructions and denied any questions.  Clonodine patch removed.  Awaiting IV nurse to remove PICC line.  Pt. In stable condition for discharge. Vanice Sarahaylor L Thompson

## 2013-09-15 NOTE — Progress Notes (Signed)
Per MD order, PICC line removed. Cath intact at 38cm. Vaseline pressure gauze to site, pressure held x 5min. No bleeding to site. Pt instructed to keep dressing CDI x 24 hours. Avoid heavy lifting, pushing or pulling x 24 hours,  If bleeding occurs hold pressure, if bleeding does not stop contact MD or go to the ED. Pt does not have any questions. Jamye Balicki M  

## 2013-09-15 NOTE — Discharge Summary (Signed)
Name: Ronnie Mays MRN: [id number] DOB: 1966-03-06 48 y.o. PCP: No Pcp Per Patient  Date of Admission: 09/10/2013  4:19 AM Date of Discharge: 09/15/2013 Attending Physician: Jonah Blue, DO  Discharge Diagnosis: 1. Alcohol Withdrawal 2. HTN  Discharge Medications:   Medication List         gabapentin 300 MG capsule  Commonly known as:  NEURONTIN  Take 300-600 mg by mouth See admin instructions. Take 1 capsule in the morning, 1 capsule at noon, and 2 capsules at bedtime     hydrochlorothiazide 25 MG tablet  Commonly known as:  HYDRODIURIL  Take 1 tablet (25 mg total) by mouth daily.     sertraline 50 MG tablet  Commonly known as:  ZOLOFT  Take 50 mg by mouth daily.        Disposition and follow-up:   RonnieAshtian Mays was discharged from [facility] in Stable condition.  At the hospital follow up visit please address:  1.  Alcohol abuse; Patient discharged w/ understanding that he was to return to rehab facility for alcohol dependence. Has patient refrained from drinking alcohol? Any further withdrawal symptoms? Seizures? Hallucinations?   HTN; Patient w/ HTN during admission, discharged w/ HCTZ 25 mg po qd. Is BP controlled? Has patient been complaint w/ medication?  2.  Labs / imaging needed at time of follow-up: BMP, magnesium  3.  Pending labs/ test needing follow-up: none  Follow-up Appointments:     Follow-up Information   Follow up with [facility] On 10/07/2013. (11:00 AM)    Contact informationRicki Rodriguez [place] [address] [phone]      Discharge Instructions: Discharge Orders   Future Appointments Provider Department Dept Phone   10/07/2013 11:00 AM [facility] Covering Provider [facility] [phone]   Future Orders Complete By Expires   Call MD for:  difficulty breathing, headache or visual disturbances  As directed    Call MD for:  persistant  dizziness or light-headedness  As directed    Call MD for:  persistant nausea and vomiting  As directed       Consultations:  PCCM  Procedures Performed:  X-ray Chest Pa And Lateral   09/10/2013   CLINICAL DATA:  Generalized body pain with agitation. Alcohol withdrawal.  EXAM: CHEST  2 VIEW  COMPARISON:  Chest radiographs 06/23/2013 and 06/21/2013.  FINDINGS: The heart size and mediastinal contours are stable. There is improved aeration of the lung bases with resolution of the previously demonstrated right middle lobe infiltrate. No focal airspace disease is demonstrated. There is mild central airway thickening. There is no pleural effusion or pneumothorax. The osseous structures appear unchanged.  IMPRESSION: Resolved right middle lobe infiltrate with mild generalized central airway thickening. No acute findings demonstrated.   Electronically Signed   By: Roxy Horseman M.D.   On: 09/10/2013 14:06   Ct Head Wo Contrast  09/10/2013   CLINICAL DATA:  DIZZINESS GAIT PROBLEM NAUSEA EXTREMITY PAIN  EXAM: CT HEAD WITHOUT CONTRAST  TECHNIQUE: Contiguous axial images were obtained from the base of the skull through the vertex without intravenous contrast.  COMPARISON:  None.  FINDINGS: No acute intracranial abnormality. Specifically, no hemorrhage, hydrocephalus, mass lesion, chronic focal infarction, or significant intracranial injury. No acute calvarial abnormality. There is mild to moderate global atrophy consistent with patient's history of ETOH abuse. Diffuse mild areas of low attenuation within the subcortical, deep, and periventricular white matter regions consistent  with mild small-vessel white matter ischemia. The visualized paranasal sinuses and mastoid air cells are patent.  IMPRESSION: No acute intracranial abnormality.   Electronically Signed   By: Salome HolmesHector  Cooper M.D.   On: 09/10/2013 07:28   [place] Abdomen Complete  09/14/2013   CLINICAL DATA:  Hepatitis  EXAM: ULTRASOUND ABDOMEN COMPLETE   COMPARISON:  None.  FINDINGS: Gallbladder:  There is dependent sludge and possible tiny stones in the gallbladder, but no wall thickening or evidence of acute cholecystitis.  Common bile duct:  Diameter: 5.3 mm.  No duct stone or sludge is seen.  Liver:  Mild increased echogenicity. Subtle surface nodularity along the anterior left lobe. No liver mass or focal lesion. Normal hepatopetal flow documented in the portal vein.  IVC:  No abnormality visualized.  Pancreas:  Visualized portion unremarkable.  Spleen:  Prominent, but not enlarged.  No mass or focal lesion.  Right Kidney:  Length: 12 cm. Echogenicity within normal limits. No mass or hydronephrosis visualized.  Left Kidney:  Length: 12.2 cm. Echogenicity within normal limits. No mass or hydronephrosis visualized.  Abdominal aorta:  No aneurysm visualized.  Other findings:  None.  IMPRESSION: 1. No acute finding.  No evidence of acute cholecystitis. 2. Gallbladder sludge and tiny stones. 3. No bile duct stones or dilation. 4. Liver shows mild increased echogenicity consistent with hepatic steatosis. Subtle surface nodularity along the left lobe is also suggested. Consider cirrhosis in the proper clinical setting. 5. No other abnormalities.   Electronically Signed   By: Amie Portlandavid  Ormond M.D.   On: 09/14/2013 16:57   Dg Chest Port 1 View  09/13/2013   CLINICAL DATA:  Alcohol withdrawal.  Evaluate for aspiration.  EXAM: PORTABLE CHEST - 1 VIEW  COMPARISON:  09/12/2013  FINDINGS: Right PICC remains in place with tip overlying the lower SVC. Cardiac silhouette is upper limits of normal in size, unchanged. Lungs are slightly hypoinflated with persistent, minimal opacity in the left lung base. Right lung remains clear. No pleural effusion or pneumothorax is identified.  IMPRESSION: Unchanged, minimal left basilar atelectasis.   Electronically Signed   By: Sebastian AcheAllen  Grady   On: 09/13/2013 07:14   Dg Chest Port 1 View  09/12/2013   CLINICAL DATA:  Intoxication, evaluate  for aspiration  EXAM: PORTABLE CHEST - 1 VIEW  COMPARISON:  Prior chest x-ray 09/11/2013  FINDINGS: Interval placement of a right upper extremity PICC. Catheter tip projects over the distal SVC. Stable cardiac and mediastinal contours. Atherosclerotic calcification noted in the transverse aorta. Very mild left basilar atelectasis. No new airspace consolidation or pleural effusion. No pneumothorax. No acute osseous abnormality. .  IMPRESSION: 1. The tip of the new right upper extremity PICC projects over the distal SVC. 2. Interval development of a trace left basilar atelectasis. Otherwise, the lungs remain clear.   Electronically Signed   By: Malachy MoanHeath  McCullough M.D.   On: 09/12/2013 08:02   Dg Chest Port 1 View  09/11/2013   CLINICAL DATA:  Elevated liver function studies. Alcohol withdrawal. Question aspiration.  EXAM: PORTABLE CHEST - 1 VIEW  COMPARISON:  09/10/2013 and 06/23/2013.  FINDINGS: 1044 hr. The heart size and mediastinal contours are normal. The lungs are clear. There is no pleural effusion or pneumothorax. No acute osseous findings are identified.  IMPRESSION: Stable chest. No evidence of aspiration or other acute cardiopulmonary process.   Electronically Signed   By: Roxy HorsemanBill  Veazey M.D.   On: 09/11/2013 11:12   Dg Abd Portable 1v  09/11/2013  CLINICAL DATA:  Elevated liver function studies. Alcohol withdrawal.  EXAM: PORTABLE ABDOMEN - 1 VIEW  COMPARISON:  None.  FINDINGS: 1048 hr. The bowel gas pattern is normal. There is no supine evidence of free intraperitoneal air or suspicious abdominal calcification. Mild degenerative changes are present within the lower lumbar spine. No acute osseous findings are evident.  IMPRESSION: No acute abdominal findings.  No evidence of bowel obstruction.   Electronically Signed   By: Roxy Horseman M.D.   On: 09/11/2013 11:14   Admission HPI: The patient is a 48 YO man who is coming to the ED from alcohol rehab. He states that his last drink was last Thursday and  that he has been off balance the last several days. He thinks that his shakes are somewhat better and overall he is doing okay. He is having a lot of stress and his marriage is breaking up and this is making him upset. He is having a headache and backache. He is nauseated but not vomiting or having diarrhea. He is not constipated. He denies chest pain or SOB. He states that he is tired of people asking him questions and is not super cooperative with the interview. He is trying to get out of bed during our talk but is easily distractible. He denies any falls recently although he has been bumping into walls a lot recently due to the balance issues. He denies dysuria.   Hospital Course by problem list:   1. Alcohol Withdrawal- Patient admitted w/ agitation, restlessness, and balance disturbance, from [facility]. EtOh and UDS negative on admission. CT head performed in the ED negative for acute pathology. Patient also noted to have elevated LFTs, thrombocytopenia, and leukopenia on admission, significant for chronic alcohol abuse. Placed on CIWA protocol for withdrawal. During first day of admission, patient became significantly agitated, irritable, and anxious, w/ significant tremulousness, given large doses of Ativan to attempt to control withdrawal symptoms, however, patient continued to be irritable and agitated, confused, hallucinating, and was combative, threatening staff and attempting to leave the hospital. He was extremely disoriented and at one point attempted to escape through his room window. PCCM took over care on the morning of 09/11/13 for alcohol withdrawal unable to be controlled by Ativan. Placed on Precedex gtt at that time, kept in soft wrist restraints and Posey belt w/ sitter at bedside. Transferred back to IMTS service on 09/15/13, weaned off of Precedex gtt on 09/14/13 and tolerated Ativan 1 mg q6h for withdrawal symptoms. Stable for discharge on 09/15/13, minimal withdrawal symptoms, did not require  Ativan on discharge. Gave extensive information by SW/CM involving rehab facilities and encouraged patient to leave hospital and go directly to facility for further management of alcohol dependence. Patient very interested in quitting at that time. Scheduled for follow up w/ [facility].   2. HTN- Patient w/ previously diagnosed HTN, had apparently been on Metoprolol + Lisinopril according to previous notes, however, never clear if patient had taken these medications. During admission, found to be hypertensive, placed on Clonidine patch 0.2 mg to allow for weaning off of Precedex as well as HTN management. BP better controlled towards discharge, sent out w/ Rx for HCTZ 25 mg daily.   Discharge Vitals:   BP 140/92  Pulse 88  Temp(Src) 98.1 F (36.7 C) (Oral)  Resp 16  Ht 5\' 10"  (1.778 m)  Wt 167 lb 1.6 oz (75.796 kg)  BMI 23.98 kg/m2  SpO2 98%  Discharge Labs:  Results for  orders placed during the hospital encounter of 09/10/13 (from the past 24 hour(s))  MAGNESIUM     Status: None   Collection Time    09/15/13  6:29 AM      Result Value Ref Range   Magnesium 1.7  1.5 - 2.5 mg/dL  PHOSPHORUS     Status: None   Collection Time    09/15/13  6:29 AM      Result Value Ref Range   Phosphorus 3.9  2.3 - 4.6 mg/dL  BASIC METABOLIC PANEL     Status: Abnormal   Collection Time    09/15/13  6:29 AM      Result Value Ref Range   Sodium 138  137 - 147 mEq/L   Potassium 3.8  3.7 - 5.3 mEq/L   Chloride 105  96 - 112 mEq/L   CO2 23  19 - 32 mEq/L   Glucose, Bld 107 (*) 70 - 99 mg/dL   BUN 10  6 - 23 mg/dL   Creatinine, Ser 7.840.54  0.50 - 1.35 mg/dL   Calcium 8.4  8.4 - 69.610.5 mg/dL   GFR calc non Af Amer >90  >90 mL/min   GFR calc Af Amer >90  >90 mL/min  CBC WITH DIFFERENTIAL     Status: Abnormal   Collection Time    09/15/13  6:29 AM      Result Value Ref Range   WBC 3.5 (*) 4.0 - 10.5 K/uL   RBC 3.66 (*) 4.22 - 5.81 MIL/uL   Hemoglobin 13.1  13.0 - 17.0 g/dL   HCT  29.537.5 (*) 28.439.0 - 52.0 %   MCV 102.5 (*) 78.0 - 100.0 fL   MCH 35.8 (*) 26.0 - 34.0 pg   MCHC 34.9  30.0 - 36.0 g/dL   RDW 13.214.0  44.011.5 - 10.215.5 %   Platelets 67 (*) 150 - 400 K/uL   Neutrophils Relative % 37 (*) 43 - 77 %   Neutro Abs 1.3 (*) 1.7 - 7.7 K/uL   Lymphocytes Relative 29  12 - 46 %   Lymphs Abs 1.0  0.7 - 4.0 K/uL   Monocytes Relative 29 (*) 3 - 12 %   Monocytes Absolute 1.0  0.1 - 1.0 K/uL   Eosinophils Relative 5  0 - 5 %   Eosinophils Absolute 0.2  0.0 - 0.7 K/uL   Basophils Relative 1  0 - 1 %   Basophils Absolute 0.1  0.0 - 0.1 K/uL    Signed: Courtney ParisEden W Ekta Dancer, MD 09/15/2013, 1:32 PM   Time Spent on Discharge: 35 minutes Services Ordered on Discharge: none Equipment Ordered on Discharge: none

## 2013-09-15 NOTE — Progress Notes (Signed)
Physical Therapy Treatment Patient Details Name: Ronnie Mays MRN: [id number] DOB: Jun 29, 1965 Today's Date: 09/15/2013    History of Present Illness 48 YO man who is coming to the ED from alcohol rehab. He states that his last drink was last Thursday and that he has been off balance the last several days. He thinks that his shakes are somewhat better and overall he is doing okay. He is having a lot of stress and his marriage is breaking up and this is making him upset. He is having a headache and backache. He is nauseated but not vomiting or having diarrhea. He is not constipated. He denies chest pain or SOB. He states that he is tired of people asking him questions and is not super cooperative with the interview. He is trying to get out of bed during our talk but is easily distractible. He denies any falls recently although he has been bumping into walls a lot recently due to the balance issues.     PT Comments    Patient demonstrates improvements in mobility and is independent at this time despite underlying balance deficits. Spoke with patient at length regarding mobility and safety. No further acute PT needs, will sign off. Patient in agreement.  Follow Up Recommendations  Supervision - Intermittent     Equipment Recommendations  None recommended by PT    Recommendations for Other Services       Precautions / Restrictions Precautions Precautions: Fall Restrictions Weight Bearing Restrictions: No    Mobility  Bed Mobility Overal bed mobility: Modified Independent                Transfers Overall transfer level: Modified independent                  Ambulation/Gait Ambulation/Gait assistance: Modified independent (Device/Increase time) Ambulation Distance (Feet): 680 Feet Assistive device: None       General Gait Details: improved stability today, patient still with some balance deviations and modest ataxia during gait, much improved compared to prior  session.   Stairs Stairs: Yes Stairs assistance: Modified independent (Device/Increase time) Stair Management: One rail Right Number of Stairs: 14 General stair comments: no difficulty performing stair negotiation today  Wheelchair Mobility    Modified Rankin (Stroke Patients Only)       Balance                                    Cognition Arousal/Alertness: Awake/alert Behavior During Therapy: WFL for tasks assessed/performed Overall Cognitive Status: Within Functional Limits for tasks assessed                      Exercises      General Comments General comments (skin integrity, edema, etc.): cues for balance follow up, education regarding sagfety with underlying balance deficits, recommend no ladders, and increased awareness of balance deficits/ depth perception difficulties. Spoke with patient at length regarding mobility expectations upon discharge.      Pertinent Vitals/Pain Mild low back pain (chronic) no value given    Home Living                      Prior Function            PT Goals (current goals can now be found in the care plan section) Acute Rehab PT Goals Patient Stated Goal: get better PT Goal Formulation: With patient  Time For Goal Achievement: 09/21/13 Potential to Achieve Goals: Good Progress towards PT goals: Progressing toward goals;Goals met/education completed, patient discharged from PT    Frequency  Min 3X/week    PT Plan Discharge plan needs to be updated    Co-evaluation             End of Session Equipment Utilized During Treatment: Gait belt Activity Tolerance: Patient tolerated treatment well Patient left: in bed;with call bell/phone within reach;with bed alarm set     Time: 1950-9326 PT Time Calculation (min): 24 min  Charges:  $Gait Training: 8-22 mins $Self Care/Home Management: 01/20/23                    G Codes:      Duncan Dull 09/25/13, 10:55 AM Alben Deeds, PT DPT   [phone]

## 2013-09-15 NOTE — Progress Notes (Signed)
  Date: 09/15/2013  Patient name: Ronnie Mays  Medical record number: [id number]  Date of birth: [birth year]   This patient has been seen and the plan of care was discussed with the house staff. Please see their note for complete details. I concur with their findings with the following additions/corrections: Feels better today. He was eating his breakfast without difficulty. He required 3 days of Precedex gtt due to DT's. He has only mild tremor on exam. I agree with clonidine for now. Watch BP. I agree with Ativan 1 mg po q6 hrs and slowly back off over next few days. He needs to abstain from EtOH. Contact prior facility to see if he can return there.  Jonah BlueAlejandro Bama Hanselman, DO, FACP [facility] 09/15/2013, 10:39 AM

## 2013-09-15 NOTE — Discharge Instructions (Signed)
1. You have a follow up appointment as scheduled:  [facility]  On 10/07/2013 @ 11:00 AM  [address] [place] [id number] [phone]  2. Please take all medications as prescribed. PLEASE STARTING TAKING HCTZ 25 MG ONCE DAILY FOR HIGH BLOOD PRESSURE.  3. If you have worsening of your symptoms or new symptoms arise, please call the clinic [phone]), or go to the ER immediately if symptoms are severe.   Alcohol Withdrawal Anytime drug use is interfering with normal living activities it has become abuse. This includes problems with family and friends. Psychological dependence has developed when your mind tells you that the drug is needed. This is usually followed by physical dependence when a continuing increase of drugs are required to get the same feeling or "high." This is known as addiction or chemical dependency. A person's risk is much higher if there is a history of chemical dependency in the family. Mild Withdrawal Following Stopping Alcohol, When Addiction or Chemical Dependency Has Developed When a person has developed tolerance to alcohol, any sudden stopping of alcohol can cause uncomfortable physical symptoms. Most of the time these are mild and consist of tremors in the hands and increases in heart rate, breathing, and temperature. Sometimes these symptoms are associated with anxiety, panic attacks, and bad dreams. There may also be stomach upset. Normal sleep patterns are often interrupted with periods of inability to sleep (insomnia). This may last for 6 months. Because of this discomfort, many people choose to continue drinking to get rid of this discomfort and to try to feel normal. Severe Withdrawal with Decreased or No Alcohol Intake, When Addiction or Chemical Dependency Has Developed About five percent of alcoholics will develop signs of severe withdrawal when they stop using alcohol. One sign of this is development of generalized  seizures (convulsions). Other signs of this are severe agitation and confusion. This may be associated with believing in things which are not real or seeing things which are not really there (delusions and hallucinations). Vitamin deficiencies are usually present if alcohol intake has been long-term. Treatment for this most often requires hospitalization and close observation. Addiction can only be helped by stopping use of all chemicals. This is hard but may save your life. With continual alcohol use, possible outcomes are usually loss of self respect and esteem, violence, and death. Addiction cannot be cured but it can be stopped. This often requires outside help and the care of professionals. Treatment centers are listed in the yellow pages under Cocaine, Narcotics, and Alcoholics Anonymous. Most hospitals and clinics can refer you to a specialized care center. It is not necessary for you to go through the uncomfortable symptoms of withdrawal. Your caregiver can provide you with medicines that will help you through this difficult period. Try to avoid situations, friends, or drugs that made it possible for you to keep using alcohol in the past. Learn how to say no. It takes a long period of time to overcome addictions to all drugs, including alcohol. There may be many times when you feel as though you want a drink. After getting rid of the physical addiction and withdrawal, you will have a lessening of the craving which tells you that you need alcohol to feel normal. Call your caregiver if more support is needed. Learn who to talk to in your family and among your friends so that during these periods you can receive outside help. Alcoholics Anonymous (AA) has helped many people over the  years. To get further help, contact AA or call your caregiver, counselor, or clergyperson. Al-Anon and Alateen are support groups for friends and family members of an alcoholic. The people who love and care for an alcoholic  often need help, too. For information about these organizations, check your phone directory or call a local alcoholism treatment center.  SEEK IMMEDIATE MEDICAL CARE IF:   You have a seizure.  You have a fever.  You experience uncontrolled vomiting or you vomit up blood. This may be bright red or look like black coffee grounds.  You have blood in the stool. This may be bright red or appear as a black, tarry, bad-smelling stool.  You become lightheaded or faint. Do not drive if you feel this way. Have someone else drive you or call [id number] for help.  You become more agitated or confused.  You develop uncontrolled anxiety.  You begin to see things that are not really there (hallucinate). Your caregiver has determined that you completely understand your medical condition, and that your mental state is back to normal. You understand that you have been treated for alcohol withdrawal, have agreed not to drink any alcohol for a minimum of 1 day, will not operate a car or other machinery for 24 hours, and have had an opportunity to ask any questions about your condition. Document Released: 02/14/2005 Document Revised: 07/30/2011 Document Reviewed: 12/24/2007 [facility] Patient Information 2014 [employer] [place].

## 2013-09-15 NOTE — Progress Notes (Addendum)
Subjective: Patient seen at bedside this AM. Doing very well today. Only mild tremulousness on exam. No active hallucinations, appetite is good, no new complaints. Denies nausea and vomiting.   Objective: Vital signs in last 24 hours: Filed Vitals:   09/14/13 1755 09/14/13 2204 09/15/13 0205 09/15/13 0523  BP: 139/75 124/87 134/76 141/97  Pulse: 93 91 102 85  Temp: 97.8 F (36.6 C) 98.3 F (36.8 C) 98.2 F (36.8 C) 98.1 F (36.7 C)  TempSrc: Axillary Oral Oral Oral  Resp: 16 16 16 16   Height:      Weight:      SpO2: 96% 98% 99% 99%   Weight change:   Intake/Output Summary (Last 24 hours) at 09/15/13 0959 Last data filed at 09/15/13 0929  Gross per 24 hour  Intake    723 ml  Output      0 ml  Net    723 ml   Physical Exam: General: Alert, cooperative, NAD. Mild tremulousness.  HEENT: PERRL, EOMI. Moist mucus membranes. Dry skin on face.  Neck: Full range of motion without pain, supple, no lymphadenopathy or carotid bruits Lungs: Clear to ascultation bilaterally, normal work of respiration, no wheezes, rales, rhonchi Heart: RRR, no murmurs, gallops, or rubs Abdomen: Soft, non-tender, non-distended, BS + Extremities: No cyanosis, clubbing, or edema Neurologic: Alert & oriented X3, cranial nerves II-XII intact, strength grossly intact, sensation intact to light touch  Lab Results: Basic Metabolic Panel:  Recent Labs Lab 09/14/13 0500 09/15/13 0629  NA 133* 138  K 4.3 3.8  CL 102 105  CO2 19 23  GLUCOSE 83 107*  BUN 10 10  CREATININE 0.51 0.54  CALCIUM 8.6 8.4  MG 1.8 1.7  PHOS 3.7 3.9   Liver Function Tests:  Recent Labs Lab 09/10/13 0430 09/13/13 0440  AST 200* 134*  ALT 121* 93*  ALKPHOS 138* 72  BILITOT 3.2* 2.3*  PROT 8.6* 5.8*  ALBUMIN 3.5 2.2*    Recent Labs Lab 09/10/13 0430  LIPASE 48    Recent Labs Lab 09/13/13 2130  AMMONIA 78*   CBC:  Recent Labs Lab 09/14/13 0500 09/15/13 0629  WBC 5.4 3.5*  NEUTROABS 2.9 1.3*  HGB  14.0 13.1  HCT 39.9 37.5*  MCV 102.0* 102.5*  PLT 74* 67*   CBG:  Recent Labs Lab 09/12/13 1947 09/13/13 09/13/13 0351 09/13/13 0708 09/13/13 1106 09/13/13 1548  GLUCAP 129* 297* 217* 115* 107* 127*   Thyroid Function Tests:  Recent Labs Lab 09/13/13 2350  TSH 2.010   Coagulation:  Recent Labs Lab 09/13/13 2130  LABPROT 16.6*  INR 1.38   Anemia Panel:  Recent Labs Lab 09/14/13 0500  VITAMINB12 1753*   Urine Drug Screen: Drugs of Abuse     Component Value Date/Time   LABOPIA NONE DETECTED 09/10/2013 0630   COCAINSCRNUR NONE DETECTED 09/10/2013 0630   LABBENZ NONE DETECTED 09/10/2013 0630   AMPHETMU NONE DETECTED 09/10/2013 0630   THCU NONE DETECTED 09/10/2013 0630   LABBARB NONE DETECTED 09/10/2013 0630    Alcohol Level:  Recent Labs Lab 09/10/13 0503  ETH <11   Urinalysis:  Recent Labs Lab 09/10/13 0630  COLORURINE AMBER*  LABSPEC 1.022  PHURINE 6.0  GLUCOSEU NEGATIVE  HGBUR NEGATIVE  BILIRUBINUR SMALL*  KETONESUR NEGATIVE  PROTEINUR NEGATIVE  UROBILINOGEN 2.0*  NITRITE NEGATIVE  LEUKOCYTESUR NEGATIVE    Micro Results: Recent Results (from the past 240 hour(s))  MRSA PCR SCREENING     Status: None   Collection Time  09/10/13 10:02 AM      Result Value Ref Range Status   MRSA by PCR NEGATIVE  NEGATIVE Final   Comment:            The GeneXpert MRSA Assay (FDA     approved for NASAL specimens     only), is one component of a     comprehensive MRSA colonization     surveillance program. It is not     intended to diagnose MRSA     infection nor to guide or     monitor treatment for     MRSA infections.   Studies/Results: US Abdomen Complete  09/14/2013   CLINICAL DATA:  Hepatitis  EXAM: ULTRASOUND ABDOMEN COMPLETE  COMPARISON:  None.  FINDINGS: Gallbladder:  There is dependent sludge and possible tiny stones in the gallbladder, but no wall thickening or evidence of acute cholecystitis.  Common bile duct:  Diameter: 5.3 mm.  No duct  stone or sludge is seen.  Liver:  Mild increased echogenicity. Subtle surface nodularity along the anterior left lobe. No liver mass or focal lesion. Normal hepatopetal flow documented in the portal vein.  IVC:  No abnormality visualized.  Pancreas:  Visualized portion unremarkable.  Spleen:  Prominent, but not enlarged.  No mass or focal lesion.  Right Kidney:  Length: 12 cm. Echogenicity within normal limits. No mass or hydronephrosis visualized.  Left Kidney:  Length: 12.2 cm. Echogenicity within normal limits. No mass or hydronephrosis visualized.  Abdominal aorta:  No aneurysm visualized.  Other findings:  None.  IMPRESSION: 1. No acute finding.  No evidence of acute cholecystitis. 2. Gallbladder sludge and tiny stones. 3. No bile duct stones or dilation. 4. Liver shows mild increased echogenicity consistent with hepatic steatosis. Subtle surface nodularity along the left lobe is also suggested. Consider cirrhosis in the proper clinical setting. 5. No other abnormalities.   Electronically Signed   By: Amie Portland M.D.   On: 09/14/2013 16:57   Medications: I have reviewed the patient's current medications. Scheduled Meds: . cloNIDine  0.2 mg Transdermal Weekly  . folic acid  1 mg Oral Daily  . LORazepam  1 mg Oral Q6H  . nicotine  14 mg Transdermal Daily  . sodium chloride  10-40 mL Intracatheter Q12H  . sodium chloride  3 mL Intravenous Q12H  . thiamine  100 mg Oral Daily   Continuous Infusions:   PRN Meds:.hydrALAZINE, ibuprofen, sodium chloride  Assessment/Plan: [name] is a 48 y.o. male w/ PMHx of alcohol abuse w/ known h/o withdrawal and withdrawal related seizures, HTN, and depression, admitted w/ severe alcohol withdrawal.  Alcohol Withdrawal- Transferred from the ICU yesterday, now 3 days s/p Precedex gtt. Doing well, minor complaints. Mild tremor on exam. Patient wishes to go to Rehab Facility on discharge.  -Clonidine patch 0.2 mg  -Ativan 1 mg q6h  -Folic acid +  Thiamine -SW consulted, will provide info for Rehab Facility  HTN- Normotensive today.  -Clonidine patch as above -Hydralazine 10-20 mg q4h prn SBP >150 -Will discharge w/ Rx for HCTZ 25 mg po qd w/ [facility].  Dispo: Discharge today.   The patient does not have a current PCP (No Pcp Per Patient) and does need an [facility] hospital follow-up appointment after discharge.  The patient does not have transportation limitations that hinder transportation to clinic appointments.  .Services Needed at time of discharge: Y = Yes, Blank = No PT:   OT:   RN:  Equipment:   Other:     LOS: 5 days   Courtney ParisEden W Laurelle Skiver, MD 09/15/2013, 9:59 AM

## 2013-09-15 NOTE — Progress Notes (Signed)
Pt. Discharged to home with daughter. Ronnie Mays

## 2013-09-15 NOTE — Progress Notes (Signed)
Pt. Attempted to leave before his 30min was up d/t PICC line removal.  I found pt. In hallway and explained to him the risks of leaving before he is medically ready d/t the PICC line removal and assisted him back to his room.  Pt. Has agreed to wait until his full 30min time period is up. Vanice Sarahaylor L Thompson

## 2013-09-15 NOTE — Clinical Social Work Note (Addendum)
CSW received consult regarding pt's previous residency at [facility] (detox/etoh treatment center). CSW has contacted [facility] admissions to inquire about pt's ability to return at time of discharge. CSW currently awaiting final decision from [place].   Per MD, pt agreeable to discharging home with long-term treatment facility resources.  Update [1 pm] Per [facility] admissions liaison, no male bed availability on 09/15/2013. [facility] admissions liaison informed CSW pt could be discharged home on 09/15/2013 and follow up regarding admissions on 09/16/2013. CSW has faxed requested clinical information for possible readmission to [facility] to admissions liaison.   CSW met with pt at bedside. Pt agreeable to discharging home on 09/15/2013 and following up with [facility] on 09/16/2013. CSW provided pt with long-term treatment resources. Pt stated appreciation of resources and informed CSW of pt's intent to follow up with facilities in [place] area.   RN to call pt's wife to inform her of pt's discharge on 09/15/2013. CSW signing off.   Pati Gallo, [place] Social Worker [phone]

## 2013-09-20 NOTE — Discharge Summary (Signed)
  Date: 09/20/2013  Patient name: Ronnie OrtWally Mchaffie  Medical record number: [id number]  Date of birth: 28-Mar-1966   This patient has been seen and the plan of care was discussed with the house staff. Please see their note for complete details. I concur with their findings and plan.  Jonah BlueAlejandro Zaniyah Wernette, DO, FACP [facility] 09/20/2013, 3:50 PM

## 2013-10-07 ENCOUNTER — Inpatient Hospital Stay: Payer: Self-pay

## 2015-11-02 IMAGING — US US ABDOMEN COMPLETE
1 series · 13 of 25 positions shown · non-contrast
Comparison: None.

CLINICAL DATA: Hepatitis

EXAM:
ULTRASOUND ABDOMEN COMPLETE

[Series 1: us abdomen complete · 0.27mm/px · 13 of 87 slices shown]
[im 1/87]
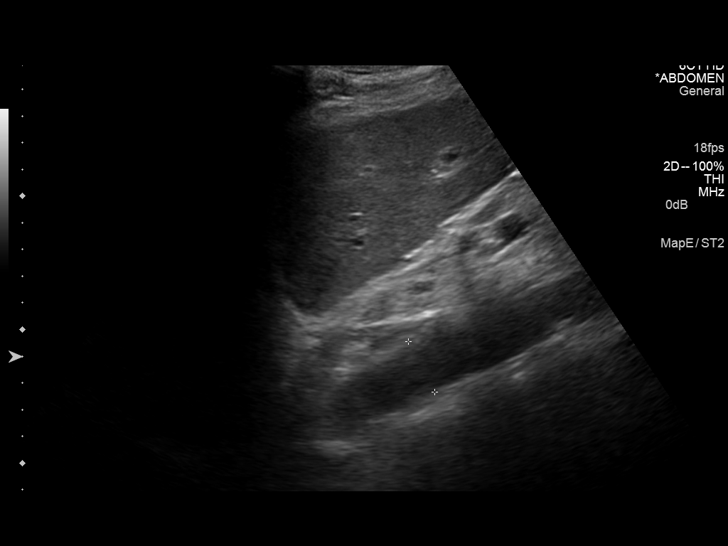
[im 8/87]
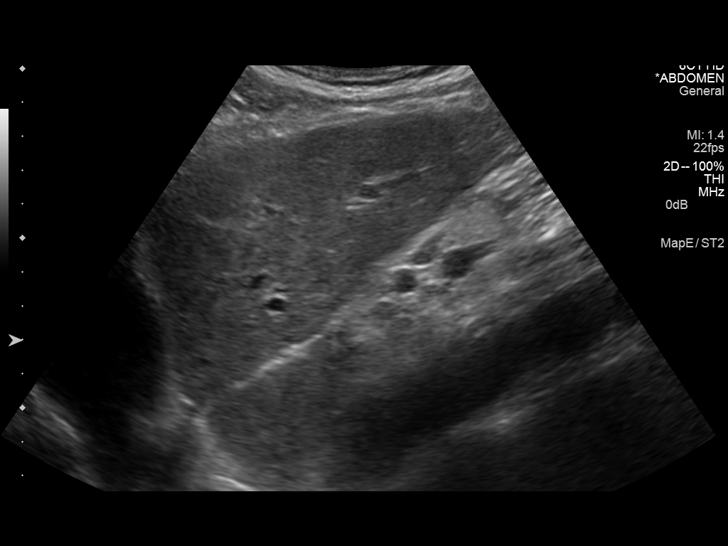
[im 15/87]
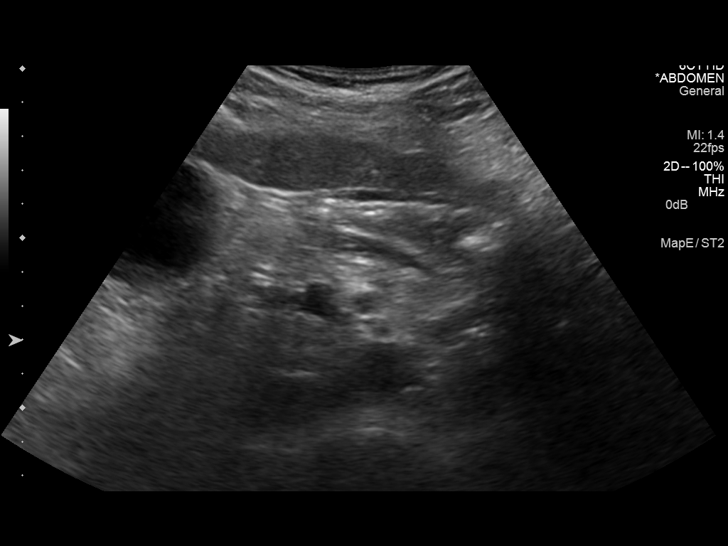
[im 22/87]
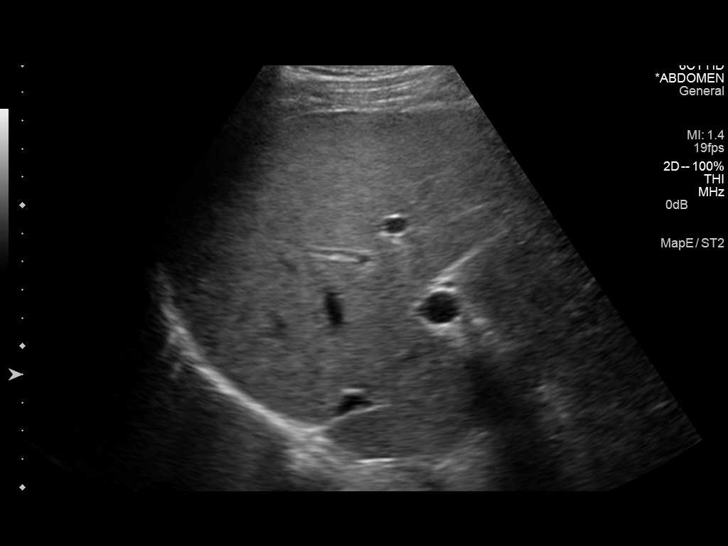
[im 29/87]
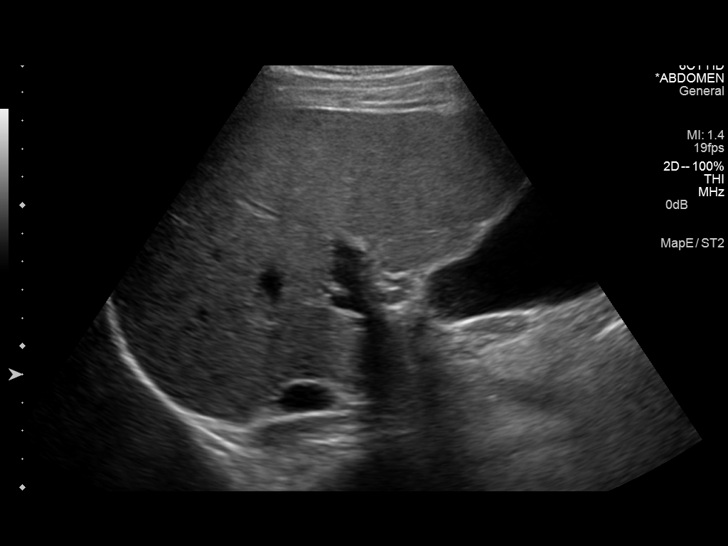
[im 36/87]
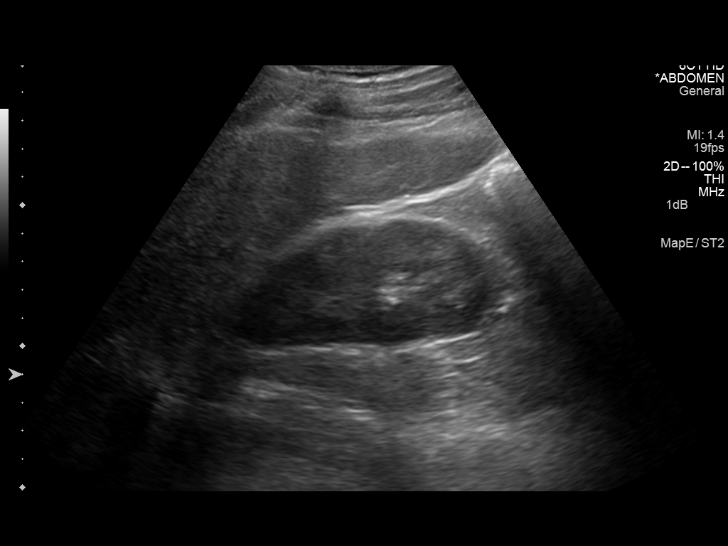
[im 44/87]
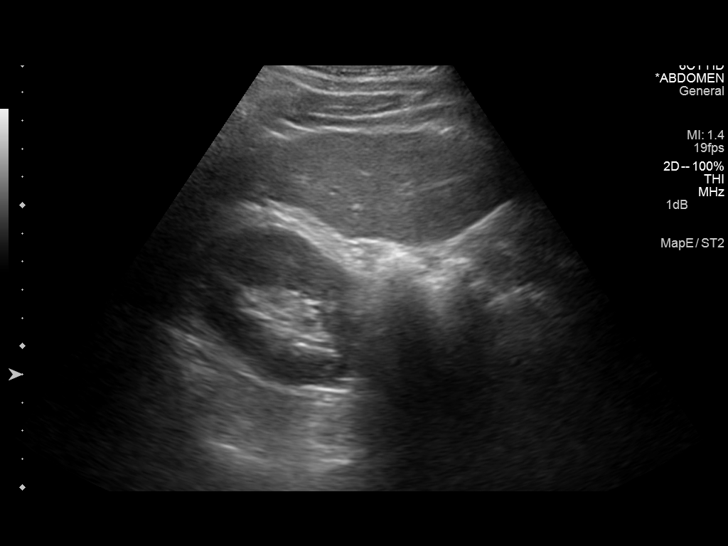
[im 51/87]
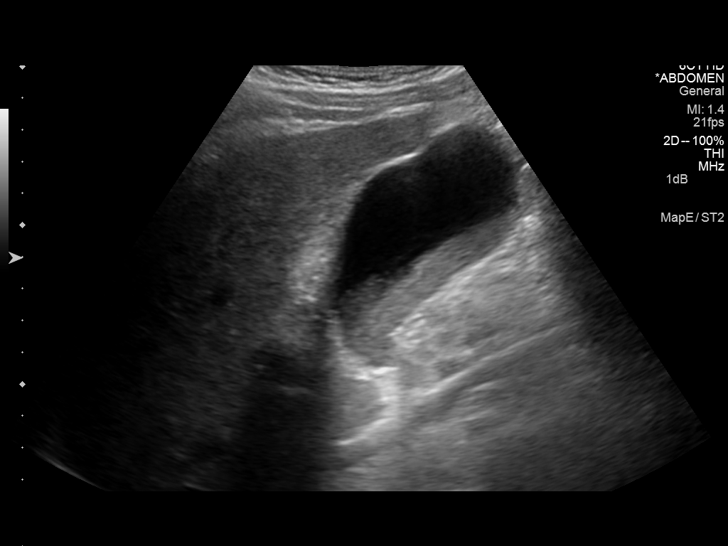
[im 58/87]
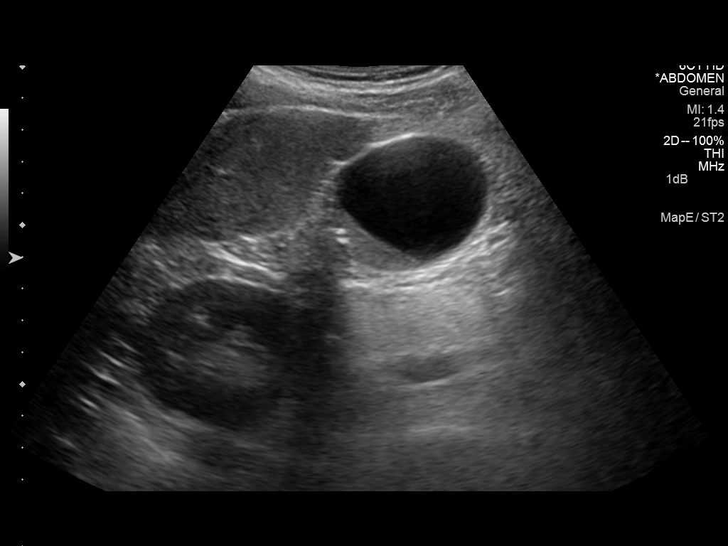
[im 65/87]
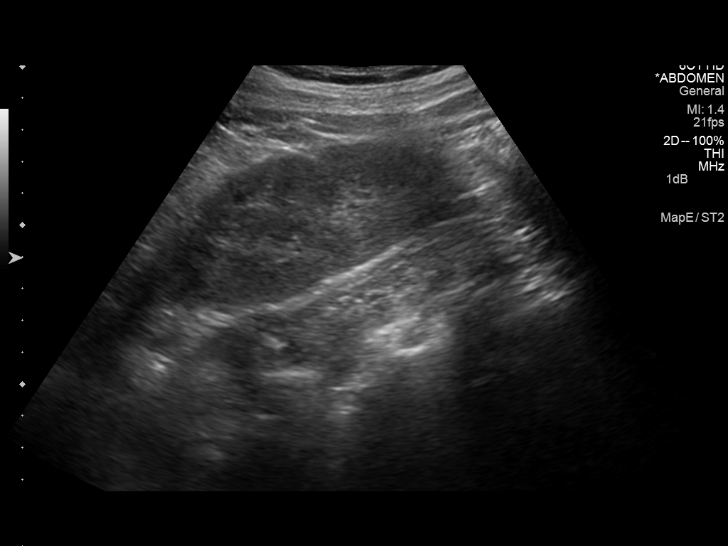
[im 72/87]
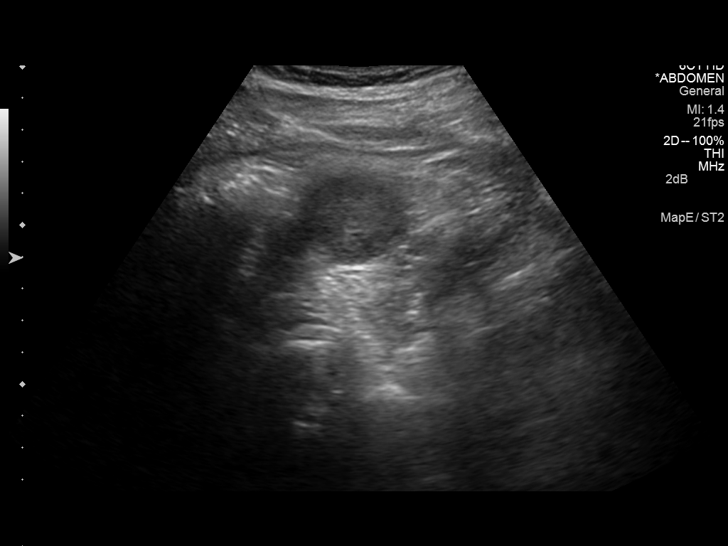
[im 79/87]
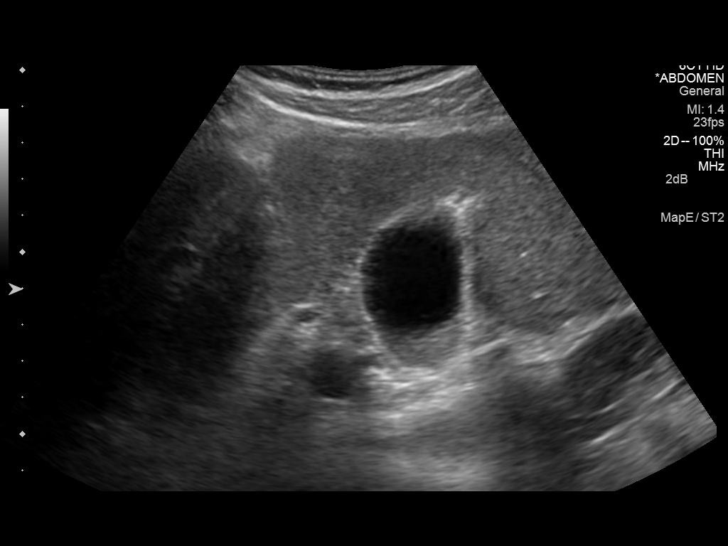
[im 87/87]
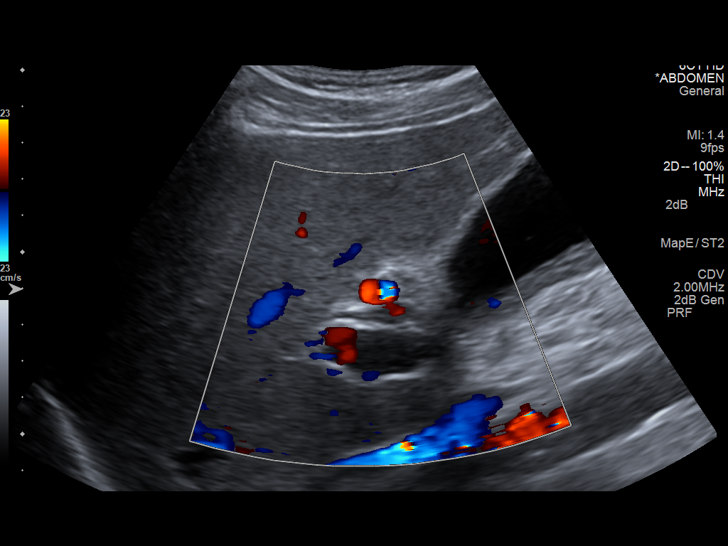

[13 of 25 positions shown; findings below may reference images not displayed]

FINDINGS: Gallbladder:

There is dependent sludge and possible tiny stones in the
gallbladder, but no wall thickening or evidence of acute
cholecystitis.

Common bile duct:

Diameter: 5.3 mm.  No duct stone or sludge is seen.

Liver:

Mild increased echogenicity. Subtle surface nodularity along the
anterior left lobe. No liver mass or focal lesion. Normal
hepatopetal flow documented in the portal vein.

IVC:

No abnormality visualized.

Pancreas:

Visualized portion unremarkable.

Spleen:

Prominent, but not enlarged.  No mass or focal lesion.

Right Kidney:

Length: 12 cm. Echogenicity within normal limits. No mass or
hydronephrosis visualized.

Left Kidney:

Length: 12.2 cm. Echogenicity within normal limits. No mass or
hydronephrosis visualized.

Abdominal aorta:

No aneurysm visualized.

Other findings:

None.
IMPRESSION: 1. No acute finding.  No evidence of acute cholecystitis.
2. Gallbladder sludge and tiny stones.
3. No bile duct stones or dilation.
4. Liver shows mild increased echogenicity consistent with hepatic
steatosis. Subtle surface nodularity along the left lobe is also
suggested. Consider cirrhosis in the proper clinical setting.
5. No other abnormalities.

## 2018-08-20 DEATH — deceased
